# Patient Record
Sex: Female | Born: 1960 | Race: Black or African American | Hispanic: No | Marital: Single | Smoking: Never smoker
Health system: Southern US, Community
[De-identification: ages and names within clinical notes are randomized; demographics above are authoritative.]

## PROBLEM LIST (undated history)

## (undated) DIAGNOSIS — D649 Anemia, unspecified: Secondary | ICD-10-CM

## (undated) DIAGNOSIS — Z9289 Personal history of other medical treatment: Secondary | ICD-10-CM

## (undated) HISTORY — PX: OTHER SURGICAL HISTORY: SHX169

---

## 1999-02-07 ENCOUNTER — Inpatient Hospital Stay (HOSPITAL_COMMUNITY): Admission: AD | Admit: 1999-02-07 | Discharge: 1999-02-10 | Payer: Self-pay | Admitting: Obstetrics and Gynecology

## 2001-01-22 ENCOUNTER — Encounter: Admission: RE | Admit: 2001-01-22 | Discharge: 2001-04-22 | Payer: Self-pay | Admitting: Surgical Oncology

## 2002-03-15 ENCOUNTER — Other Ambulatory Visit: Admission: RE | Admit: 2002-03-15 | Discharge: 2002-03-15 | Payer: Self-pay | Admitting: Obstetrics and Gynecology

## 2002-08-24 ENCOUNTER — Emergency Department (HOSPITAL_COMMUNITY): Admission: EM | Admit: 2002-08-24 | Discharge: 2002-08-24 | Payer: Self-pay | Admitting: Emergency Medicine

## 2002-09-08 HISTORY — PX: GASTRIC BYPASS: SHX52

## 2004-09-08 HISTORY — PX: ABDOMINOPLASTY: SUR9

## 2004-10-16 ENCOUNTER — Ambulatory Visit (HOSPITAL_COMMUNITY): Admission: RE | Admit: 2004-10-16 | Discharge: 2004-10-16 | Payer: Self-pay | Admitting: Plastic Surgery

## 2004-11-12 ENCOUNTER — Encounter: Admission: RE | Admit: 2004-11-12 | Discharge: 2005-02-10 | Payer: Self-pay

## 2004-12-19 ENCOUNTER — Emergency Department (HOSPITAL_COMMUNITY): Admission: EM | Admit: 2004-12-19 | Discharge: 2004-12-19 | Payer: Self-pay | Admitting: Emergency Medicine

## 2004-12-23 ENCOUNTER — Other Ambulatory Visit: Admission: RE | Admit: 2004-12-23 | Discharge: 2004-12-23 | Payer: Self-pay | Admitting: Obstetrics and Gynecology

## 2004-12-30 ENCOUNTER — Ambulatory Visit (HOSPITAL_COMMUNITY): Admission: RE | Admit: 2004-12-30 | Discharge: 2004-12-30 | Payer: Self-pay | Admitting: Obstetrics and Gynecology

## 2005-01-02 ENCOUNTER — Ambulatory Visit (HOSPITAL_COMMUNITY): Admission: RE | Admit: 2005-01-02 | Discharge: 2005-01-02 | Payer: Self-pay | Admitting: Family Medicine

## 2006-10-07 ENCOUNTER — Ambulatory Visit (HOSPITAL_COMMUNITY): Admission: RE | Admit: 2006-10-07 | Discharge: 2006-10-07 | Payer: Self-pay | Admitting: Family Medicine

## 2008-11-21 ENCOUNTER — Encounter: Admission: RE | Admit: 2008-11-21 | Discharge: 2008-11-21 | Payer: Self-pay | Admitting: Family Medicine

## 2009-10-15 ENCOUNTER — Ambulatory Visit (HOSPITAL_COMMUNITY): Admission: RE | Admit: 2009-10-15 | Discharge: 2009-10-15 | Payer: Self-pay | Admitting: Family Medicine

## 2010-09-08 DIAGNOSIS — Z9289 Personal history of other medical treatment: Secondary | ICD-10-CM

## 2010-09-08 HISTORY — DX: Personal history of other medical treatment: Z92.89

## 2010-09-26 ENCOUNTER — Other Ambulatory Visit (HOSPITAL_COMMUNITY): Payer: Self-pay | Admitting: Family Medicine

## 2010-09-26 DIAGNOSIS — Z0000 Encounter for general adult medical examination without abnormal findings: Secondary | ICD-10-CM

## 2010-09-29 ENCOUNTER — Encounter: Payer: Self-pay | Admitting: Family Medicine

## 2010-10-16 ENCOUNTER — Ambulatory Visit (HOSPITAL_COMMUNITY): Admission: RE | Admit: 2010-10-16 | Payer: Self-pay | Source: Home / Self Care | Admitting: Family Medicine

## 2010-10-16 ENCOUNTER — Ambulatory Visit (HOSPITAL_COMMUNITY)
Admission: RE | Admit: 2010-10-16 | Discharge: 2010-10-16 | Disposition: A | Discharge status: Home / Self Care | Payer: 59 | Source: Ambulatory Visit | Attending: Family Medicine | Admitting: Family Medicine

## 2010-10-16 ENCOUNTER — Ambulatory Visit (HOSPITAL_COMMUNITY): Payer: Self-pay

## 2010-10-16 DIAGNOSIS — Z1231 Encounter for screening mammogram for malignant neoplasm of breast: Secondary | ICD-10-CM | POA: Insufficient documentation

## 2010-10-16 DIAGNOSIS — Z0000 Encounter for general adult medical examination without abnormal findings: Secondary | ICD-10-CM

## 2011-02-24 ENCOUNTER — Encounter (HOSPITAL_COMMUNITY): Payer: 59 | Attending: Family Medicine

## 2011-02-24 DIAGNOSIS — E538 Deficiency of other specified B group vitamins: Secondary | ICD-10-CM | POA: Insufficient documentation

## 2011-02-24 DIAGNOSIS — Z9884 Bariatric surgery status: Secondary | ICD-10-CM | POA: Insufficient documentation

## 2011-02-24 DIAGNOSIS — D509 Iron deficiency anemia, unspecified: Secondary | ICD-10-CM | POA: Insufficient documentation

## 2011-03-07 ENCOUNTER — Encounter: Payer: 59 | Admitting: Hematology and Oncology

## 2011-04-01 ENCOUNTER — Other Ambulatory Visit: Payer: Self-pay | Admitting: Hematology and Oncology

## 2011-04-01 ENCOUNTER — Encounter (HOSPITAL_BASED_OUTPATIENT_CLINIC_OR_DEPARTMENT_OTHER): Payer: 59 | Admitting: Hematology and Oncology

## 2011-04-01 DIAGNOSIS — D539 Nutritional anemia, unspecified: Secondary | ICD-10-CM

## 2011-04-01 DIAGNOSIS — Z9884 Bariatric surgery status: Secondary | ICD-10-CM

## 2011-04-01 DIAGNOSIS — D509 Iron deficiency anemia, unspecified: Secondary | ICD-10-CM

## 2011-04-01 LAB — CBC WITH DIFFERENTIAL/PLATELET
BASO%: 1.3 % (ref 0.0–2.0)
Basophils Absolute: 0 10*3/uL (ref 0.0–0.1)
EOS%: 4 % (ref 0.0–7.0)
Eosinophils Absolute: 0.1 10*3/uL (ref 0.0–0.5)
HCT: 34.4 % — ABNORMAL LOW (ref 34.8–46.6)
HGB: 10.7 g/dL — ABNORMAL LOW (ref 11.6–15.9)
LYMPH%: 39.8 % (ref 14.0–49.7)
MCH: 23.7 pg — ABNORMAL LOW (ref 25.1–34.0)
MCHC: 31.1 g/dL — ABNORMAL LOW (ref 31.5–36.0)
MCV: 76.1 fL — ABNORMAL LOW (ref 79.5–101.0)
MONO#: 0.3 10*3/uL (ref 0.1–0.9)
MONO%: 8.7 % (ref 0.0–14.0)
NEUT#: 1.4 10*3/uL — ABNORMAL LOW (ref 1.5–6.5)
NEUT%: 46.2 % (ref 38.4–76.8)
Platelets: 180 10*3/uL (ref 145–400)
RBC: 4.52 10*6/uL (ref 3.70–5.45)
WBC: 3 10*3/uL — ABNORMAL LOW (ref 3.9–10.3)
lymph#: 1.2 10*3/uL (ref 0.9–3.3)
nRBC: 0 % (ref 0–0)

## 2011-04-01 LAB — URINALYSIS, MICROSCOPIC - CHCC
Bilirubin (Urine): NEGATIVE
Blood: NEGATIVE
Glucose: NEGATIVE g/dL
Ketones: NEGATIVE mg/dL
Leukocyte Esterase: NEGATIVE
Nitrite: NEGATIVE
Protein: NEGATIVE mg/dL
RBC count: NEGATIVE (ref 0–2)
Specific Gravity, Urine: 1.01 (ref 1.003–1.035)
pH: 5 (ref 4.6–8.0)

## 2011-04-04 LAB — PROTEIN ELECTROPHORESIS, SERUM, WITH REFLEX
Albumin ELP: 56.4 % (ref 55.8–66.1)
Alpha-1-Globulin: 4.1 % (ref 2.9–4.9)
Alpha-2-Globulin: 9.3 % (ref 7.1–11.8)
Beta 2: 7.1 % — ABNORMAL HIGH (ref 3.2–6.5)
Beta Globulin: 5.5 % (ref 4.7–7.2)
Gamma Globulin: 17.6 % (ref 11.1–18.8)
Total Protein, Serum Electrophoresis: 6.8 g/dL (ref 6.0–8.3)

## 2011-04-04 LAB — COMPREHENSIVE METABOLIC PANEL
ALT: 10 U/L (ref 0–35)
AST: 18 U/L (ref 0–37)
Albumin: 4 g/dL (ref 3.5–5.2)
Alkaline Phosphatase: 36 U/L — ABNORMAL LOW (ref 39–117)
BUN: 9 mg/dL (ref 6–23)
CO2: 26 mEq/L (ref 19–32)
Calcium: 8.6 mg/dL (ref 8.4–10.5)
Chloride: 104 mEq/L (ref 96–112)
Creatinine, Ser: 0.66 mg/dL (ref 0.50–1.10)
Glucose, Bld: 83 mg/dL (ref 70–99)
Potassium: 3.7 mEq/L (ref 3.5–5.3)
Sodium: 139 mEq/L (ref 135–145)
Total Bilirubin: 0.3 mg/dL (ref 0.3–1.2)
Total Protein: 6.8 g/dL (ref 6.0–8.3)

## 2011-04-04 LAB — HEMOGLOBINOPATHY EVALUATION
Hemoglobin Other: 0 % (ref 0.0–0.0)
Hgb A2 Quant: 2.5 % (ref 2.2–3.2)
Hgb A: 97.5 % (ref 96.8–97.8)
Hgb F Quant: 0 % (ref 0.0–2.0)
Hgb S Quant: 0 % (ref 0.0–0.0)

## 2011-04-04 LAB — VITAMIN B12: Vitamin B-12: 519 pg/mL (ref 211–911)

## 2011-04-04 LAB — IRON AND TIBC
%SAT: 24 % (ref 20–55)
Iron: 67 ug/dL (ref 42–145)
TIBC: 274 ug/dL (ref 250–470)
UIBC: 207 ug/dL

## 2011-04-04 LAB — FOLATE: Folate: 17 ng/mL

## 2011-04-04 LAB — FERRITIN: Ferritin: 217 ng/mL (ref 10–291)

## 2011-09-10 ENCOUNTER — Other Ambulatory Visit (HOSPITAL_COMMUNITY): Payer: Self-pay | Admitting: Family Medicine

## 2011-09-10 DIAGNOSIS — Z1231 Encounter for screening mammogram for malignant neoplasm of breast: Secondary | ICD-10-CM

## 2011-10-20 ENCOUNTER — Ambulatory Visit (HOSPITAL_COMMUNITY)
Admission: RE | Admit: 2011-10-20 | Discharge: 2011-10-20 | Disposition: A | Discharge status: Home / Self Care | Payer: 59 | Source: Ambulatory Visit | Attending: Family Medicine | Admitting: Family Medicine

## 2011-10-20 DIAGNOSIS — Z1231 Encounter for screening mammogram for malignant neoplasm of breast: Secondary | ICD-10-CM

## 2012-09-14 ENCOUNTER — Encounter (HOSPITAL_COMMUNITY): Payer: Self-pay | Admitting: Pharmacist

## 2012-09-17 ENCOUNTER — Other Ambulatory Visit: Payer: Self-pay | Admitting: Obstetrics & Gynecology

## 2012-09-23 ENCOUNTER — Encounter (HOSPITAL_COMMUNITY): Payer: Self-pay

## 2012-09-23 ENCOUNTER — Encounter (HOSPITAL_COMMUNITY)
Admission: RE | Admit: 2012-09-23 | Discharge: 2012-09-23 | Disposition: A | Discharge status: Home / Self Care | Payer: 59 | Source: Ambulatory Visit | Attending: Obstetrics & Gynecology | Admitting: Obstetrics & Gynecology

## 2012-09-23 HISTORY — DX: Anemia, unspecified: D64.9

## 2012-09-23 HISTORY — DX: Personal history of other medical treatment: Z92.89

## 2012-09-23 LAB — CBC
HCT: 32.2 % — ABNORMAL LOW (ref 36.0–46.0)
Hemoglobin: 10.3 g/dL — ABNORMAL LOW (ref 12.0–15.0)
MCH: 27.2 pg (ref 26.0–34.0)
MCHC: 32 g/dL (ref 30.0–36.0)
MCV: 85 fL (ref 78.0–100.0)
Platelets: 228 10*3/uL (ref 150–400)
RBC: 3.79 MIL/uL — ABNORMAL LOW (ref 3.87–5.11)
RDW: 19 % — ABNORMAL HIGH (ref 11.5–15.5)
WBC: 4.6 10*3/uL (ref 4.0–10.5)

## 2012-09-23 LAB — SURGICAL PCR SCREEN
MRSA, PCR: NEGATIVE
Staphylococcus aureus: POSITIVE — AB

## 2012-09-23 NOTE — Patient Instructions (Addendum)
   Your procedure is scheduled RU:[id number] January 20th  Enter through the Main Entrance of [facility] at:11:30am Pick up the phone at the desk and dial [phone] and inform us of your arrival.  Please call this number if you have any problems the morning of surgery: [phone]  Remember: Do not eat food after midnight on Sunday You may have clear liquids until 9am on Monday then nothing   Do not wear jewelry, make-up, or FINGER nail polish No metal in your hair or on your body. Do not wear lotions, powders, perfumes. You may wear deodorant.  Please use your CHG wash as directed prior to surgery.  Do not shave anywhere for at least 12 hours prior to first CHG shower.  Do not bring valuables to the hospital. Contacts, dentures or bridgework may not be worn into surgery.  Leave suitcase in the car. After Surgery it may be brought to your room. For patients being admitted to the hospital, checkout time is 11:00am the day of discharge.  Patients discharged on the day of surgery will not be allowed to drive home.

## 2012-09-26 MED ORDER — DEXTROSE 5 % IV SOLN
2.0000 g | Freq: Once | INTRAVENOUS | Status: AC
  Administered 2012-09-27: 2 g via INTRAVENOUS
  Filled 2012-09-26: qty 2

## 2012-09-27 ENCOUNTER — Encounter (HOSPITAL_COMMUNITY)
Admission: RE | Disposition: A | Discharge status: Home / Self Care | Payer: Self-pay | Source: Ambulatory Visit | Attending: Obstetrics & Gynecology

## 2012-09-27 ENCOUNTER — Ambulatory Visit (HOSPITAL_COMMUNITY): Payer: 59 | Admitting: Certified Registered"

## 2012-09-27 ENCOUNTER — Encounter (HOSPITAL_COMMUNITY): Payer: Self-pay | Admitting: *Deleted

## 2012-09-27 ENCOUNTER — Observation Stay (HOSPITAL_COMMUNITY)
Admission: RE | Admit: 2012-09-27 | Discharge: 2012-09-28 | Disposition: A | Discharge status: Home / Self Care | Payer: 59 | Source: Ambulatory Visit | Attending: Obstetrics & Gynecology | Admitting: Obstetrics & Gynecology

## 2012-09-27 ENCOUNTER — Encounter (HOSPITAL_COMMUNITY): Payer: Self-pay | Admitting: Certified Registered"

## 2012-09-27 DIAGNOSIS — Z01818 Encounter for other preprocedural examination: Secondary | ICD-10-CM | POA: Insufficient documentation

## 2012-09-27 DIAGNOSIS — N8000 Endometriosis of the uterus, unspecified: Secondary | ICD-10-CM | POA: Insufficient documentation

## 2012-09-27 DIAGNOSIS — N946 Dysmenorrhea, unspecified: Secondary | ICD-10-CM | POA: Insufficient documentation

## 2012-09-27 DIAGNOSIS — N920 Excessive and frequent menstruation with regular cycle: Principal | ICD-10-CM | POA: Insufficient documentation

## 2012-09-27 DIAGNOSIS — Z90710 Acquired absence of both cervix and uterus: Secondary | ICD-10-CM | POA: Diagnosis not present

## 2012-09-27 DIAGNOSIS — Z01812 Encounter for preprocedural laboratory examination: Secondary | ICD-10-CM | POA: Insufficient documentation

## 2012-09-27 DIAGNOSIS — N949 Unspecified condition associated with female genital organs and menstrual cycle: Secondary | ICD-10-CM | POA: Insufficient documentation

## 2012-09-27 HISTORY — PX: ROBOTIC ASSISTED TOTAL HYSTERECTOMY: SHX6085

## 2012-09-27 HISTORY — PX: BILATERAL SALPINGECTOMY: SHX5743

## 2012-09-27 LAB — TYPE AND SCREEN
ABO/RH(D): O POS
Antibody Screen: NEGATIVE

## 2012-09-27 LAB — ABO/RH: ABO/RH(D): O POS

## 2012-09-27 LAB — PREGNANCY, URINE: Preg Test, Ur: NEGATIVE

## 2012-09-27 SURGERY — ROBOTIC ASSISTED TOTAL HYSTERECTOMY
Anesthesia: General | Wound class: Clean Contaminated

## 2012-09-27 MED ORDER — ACETAMINOPHEN 10 MG/ML IV SOLN
INTRAVENOUS | Status: AC
  Filled 2012-09-27: qty 100

## 2012-09-27 MED ORDER — KETOROLAC TROMETHAMINE 30 MG/ML IJ SOLN
INTRAMUSCULAR | Status: AC
  Administered 2012-09-27: 30 mg via INTRAVENOUS
  Filled 2012-09-27: qty 1

## 2012-09-27 MED ORDER — DEXAMETHASONE SODIUM PHOSPHATE 10 MG/ML IJ SOLN
INTRAMUSCULAR | Status: AC
  Filled 2012-09-27: qty 1

## 2012-09-27 MED ORDER — FENTANYL CITRATE 0.05 MG/ML IJ SOLN
INTRAMUSCULAR | Status: AC
  Filled 2012-09-27: qty 5

## 2012-09-27 MED ORDER — FENTANYL CITRATE 0.05 MG/ML IJ SOLN
INTRAMUSCULAR | Status: AC
  Filled 2012-09-27: qty 2

## 2012-09-27 MED ORDER — PROPOFOL 10 MG/ML IV EMUL
INTRAVENOUS | Status: AC
  Filled 2012-09-27: qty 20

## 2012-09-27 MED ORDER — FENTANYL CITRATE 0.05 MG/ML IJ SOLN
INTRAMUSCULAR | Status: DC | PRN
  Administered 2012-09-27: 200 ug via INTRAVENOUS
  Administered 2012-09-27: 50 ug via INTRAVENOUS
  Administered 2012-09-27: 100 ug via INTRAVENOUS

## 2012-09-27 MED ORDER — LIDOCAINE HCL (CARDIAC) 20 MG/ML IV SOLN
INTRAVENOUS | Status: DC | PRN
  Administered 2012-09-27: 60 mg via INTRAVENOUS

## 2012-09-27 MED ORDER — FENTANYL CITRATE 0.05 MG/ML IJ SOLN
25.0000 ug | INTRAMUSCULAR | Status: DC | PRN
  Administered 2012-09-27: 50 ug via INTRAVENOUS

## 2012-09-27 MED ORDER — LACTATED RINGERS IV SOLN
INTRAVENOUS | Status: DC
  Administered 2012-09-27 – 2012-09-28 (×2): via INTRAVENOUS

## 2012-09-27 MED ORDER — ONDANSETRON HCL 4 MG/2ML IJ SOLN
INTRAMUSCULAR | Status: DC | PRN
  Administered 2012-09-27: 4 mg via INTRAVENOUS

## 2012-09-27 MED ORDER — NEOSTIGMINE METHYLSULFATE 1 MG/ML IJ SOLN
INTRAMUSCULAR | Status: DC | PRN
  Administered 2012-09-27: 2 mg via INTRAVENOUS

## 2012-09-27 MED ORDER — DEXAMETHASONE SODIUM PHOSPHATE 4 MG/ML IJ SOLN
INTRAMUSCULAR | Status: DC | PRN
  Administered 2012-09-27: 4 mg via INTRAVENOUS

## 2012-09-27 MED ORDER — ACETAMINOPHEN 10 MG/ML IV SOLN
1000.0000 mg | Freq: Four times a day (QID) | INTRAVENOUS | Status: DC
  Administered 2012-09-27: 1000 mg via INTRAVENOUS

## 2012-09-27 MED ORDER — LACTATED RINGERS IV SOLN
INTRAVENOUS | Status: DC
  Administered 2012-09-27 (×3): via INTRAVENOUS

## 2012-09-27 MED ORDER — BUPIVACAINE HCL (PF) 0.25 % IJ SOLN
INTRAMUSCULAR | Status: DC | PRN
  Administered 2012-09-27: 16 mL

## 2012-09-27 MED ORDER — MIDAZOLAM HCL 5 MG/5ML IJ SOLN
INTRAMUSCULAR | Status: DC | PRN
  Administered 2012-09-27: 2 mg via INTRAVENOUS

## 2012-09-27 MED ORDER — STERILE WATER FOR IRRIGATION IR SOLN
Status: DC | PRN
  Administered 2012-09-27: 1000 mL via INTRAVESICAL

## 2012-09-27 MED ORDER — ROCURONIUM BROMIDE 100 MG/10ML IV SOLN
INTRAVENOUS | Status: DC | PRN
  Administered 2012-09-27: 50 mg via INTRAVENOUS
  Administered 2012-09-27 (×2): 15 mg via INTRAVENOUS
  Administered 2012-09-27: 10 mg via INTRAVENOUS

## 2012-09-27 MED ORDER — IBUPROFEN 600 MG PO TABS: 600.0000 mg | ORAL_TABLET | Freq: Four times a day (QID) | ORAL | Status: DC | PRN

## 2012-09-27 MED ORDER — KETOROLAC TROMETHAMINE 30 MG/ML IJ SOLN
15.0000 mg | Freq: Once | INTRAMUSCULAR | Status: AC | PRN
  Administered 2012-09-27: 30 mg via INTRAVENOUS

## 2012-09-27 MED ORDER — GLYCOPYRROLATE 0.2 MG/ML IJ SOLN
INTRAMUSCULAR | Status: AC
  Filled 2012-09-27: qty 1

## 2012-09-27 MED ORDER — FENTANYL CITRATE 0.05 MG/ML IJ SOLN
INTRAMUSCULAR | Status: AC
  Administered 2012-09-27: 50 ug via INTRAVENOUS
  Filled 2012-09-27: qty 2

## 2012-09-27 MED ORDER — OXYCODONE-ACETAMINOPHEN 5-325 MG PO TABS
1.0000 | ORAL_TABLET | ORAL | Status: DC | PRN
  Administered 2012-09-27 – 2012-09-28 (×4): 1 via ORAL
  Filled 2012-09-27 (×4): qty 1

## 2012-09-27 MED ORDER — HYDROMORPHONE HCL PF 1 MG/ML IJ SOLN: 1.0000 mg | INTRAMUSCULAR | Status: DC | PRN

## 2012-09-27 MED ORDER — MIDAZOLAM HCL 2 MG/2ML IJ SOLN: 0.5000 mg | Freq: Once | INTRAMUSCULAR | Status: DC | PRN

## 2012-09-27 MED ORDER — MIDAZOLAM HCL 2 MG/2ML IJ SOLN
INTRAMUSCULAR | Status: AC
  Filled 2012-09-27: qty 2

## 2012-09-27 MED ORDER — ROCURONIUM BROMIDE 50 MG/5ML IV SOLN
INTRAVENOUS | Status: AC
  Filled 2012-09-27: qty 1

## 2012-09-27 MED ORDER — MEPERIDINE HCL 25 MG/ML IJ SOLN: 6.2500 mg | INTRAMUSCULAR | Status: DC | PRN

## 2012-09-27 MED ORDER — LACTATED RINGERS IR SOLN
Status: DC | PRN
  Administered 2012-09-27: 3000 mL

## 2012-09-27 MED ORDER — GLYCOPYRROLATE 0.2 MG/ML IJ SOLN
INTRAMUSCULAR | Status: DC | PRN
  Administered 2012-09-27: 0.4 mg via INTRAVENOUS

## 2012-09-27 MED ORDER — PROPOFOL 10 MG/ML IV EMUL
INTRAVENOUS | Status: DC | PRN
  Administered 2012-09-27: 200 mg via INTRAVENOUS
  Administered 2012-09-27 (×2): 50 mg via INTRAVENOUS

## 2012-09-27 MED ORDER — BUPIVACAINE HCL (PF) 0.25 % IJ SOLN
INTRAMUSCULAR | Status: AC
  Filled 2012-09-27: qty 30

## 2012-09-27 MED ORDER — PROMETHAZINE HCL 25 MG/ML IJ SOLN: 6.2500 mg | INTRAMUSCULAR | Status: DC | PRN

## 2012-09-27 MED ORDER — NEOSTIGMINE METHYLSULFATE 1 MG/ML IJ SOLN
INTRAMUSCULAR | Status: AC
  Filled 2012-09-27: qty 1

## 2012-09-27 MED ORDER — ONDANSETRON HCL 4 MG/2ML IJ SOLN
INTRAMUSCULAR | Status: AC
  Filled 2012-09-27: qty 2

## 2012-09-27 MED ORDER — LIDOCAINE HCL (PF) 1 % IJ SOLN
INTRAMUSCULAR | Status: AC
  Filled 2012-09-27: qty 30

## 2012-09-27 SURGICAL SUPPLY — 63 items
BAG URINE DRAINAGE (UROLOGICAL SUPPLIES) ×3 IMPLANT
BARRIER ADHS 3X4 INTERCEED (GAUZE/BANDAGES/DRESSINGS) ×3 IMPLANT
BLADE LAPAROSCOPIC MORCELL KIT (BLADE) IMPLANT
CATH FOLEY 3WAY  5CC 16FR (CATHETERS) ×1
CATH FOLEY 3WAY 5CC 16FR (CATHETERS) ×2 IMPLANT
CLOTH BEACON ORANGE TIMEOUT ST (SAFETY) ×3 IMPLANT
CONT PATH 16OZ SNAP LID 3702 (MISCELLANEOUS) ×3 IMPLANT
COVER MAYO STAND STRL (DRAPES) ×3 IMPLANT
COVER TABLE BACK 60X90 (DRAPES) ×6 IMPLANT
COVER TIP SHEARS 8 DVNC (MISCELLANEOUS) ×2 IMPLANT
COVER TIP SHEARS 8MM DA VINCI (MISCELLANEOUS) ×1
DECANTER SPIKE VIAL GLASS SM (MISCELLANEOUS) ×3 IMPLANT
DERMABOND ADVANCED (GAUZE/BANDAGES/DRESSINGS) ×1
DERMABOND ADVANCED .7 DNX12 (GAUZE/BANDAGES/DRESSINGS) ×2 IMPLANT
DRAPE HUG U DISPOSABLE (DRAPE) ×3 IMPLANT
DRAPE LG THREE QUARTER DISP (DRAPES) ×6 IMPLANT
DRAPE WARM FLUID 44X44 (DRAPE) ×3 IMPLANT
ELECT REM PT RETURN 9FT ADLT (ELECTROSURGICAL) ×3
ELECTRODE REM PT RTRN 9FT ADLT (ELECTROSURGICAL) ×2 IMPLANT
EVACUATOR SMOKE 8.L (FILTER) ×3 IMPLANT
GAUZE VASELINE 3X9 (GAUZE/BANDAGES/DRESSINGS) IMPLANT
GLOVE BIO SURGEON STRL SZ 6.5 (GLOVE) ×6 IMPLANT
GLOVE BIO SURGEON STRL SZ7 (GLOVE) ×6 IMPLANT
GLOVE BIOGEL PI IND STRL 7.0 (GLOVE) ×2 IMPLANT
GLOVE BIOGEL PI INDICATOR 7.0 (GLOVE) ×1
GOWN STRL REIN XL XLG (GOWN DISPOSABLE) ×18 IMPLANT
IV STOPCOCK 4 WAY 40  W/Y SET (IV SOLUTION)
IV STOPCOCK 4 WAY 40 W/Y SET (IV SOLUTION) IMPLANT
KIT ACCESSORY DA VINCI DISP (KITS) ×1
KIT ACCESSORY DVNC DISP (KITS) ×2 IMPLANT
LEGGING LITHOTOMY PAIR STRL (DRAPES) ×3 IMPLANT
NEEDLE HYPO 22GX1.5 SAFETY (NEEDLE) IMPLANT
OCCLUDER COLPOPNEUMO (BALLOONS) ×3 IMPLANT
PACK LAVH (CUSTOM PROCEDURE TRAY) ×3 IMPLANT
PAD PREP 24X48 CUFFED NSTRL (MISCELLANEOUS) ×6 IMPLANT
PLUG CATH AND CAP STER (CATHETERS) ×3 IMPLANT
PROTECTOR NERVE ULNAR (MISCELLANEOUS) ×6 IMPLANT
SET CYSTO W/LG BORE CLAMP LF (SET/KITS/TRAYS/PACK) ×3 IMPLANT
SET IRRIG TUBING LAPAROSCOPIC (IRRIGATION / IRRIGATOR) ×6 IMPLANT
SOLUTION ELECTROLUBE (MISCELLANEOUS) ×3 IMPLANT
SUT VIC AB 0 CT1 27 (SUTURE)
SUT VIC AB 0 CT1 27XBRD ANTBC (SUTURE) IMPLANT
SUT VIC AB 4-0 PS2 27 (SUTURE) ×6 IMPLANT
SUT VICRYL 0 27 CT2 27 ABS (SUTURE) IMPLANT
SUT VICRYL 0 UR6 27IN ABS (SUTURE) ×6 IMPLANT
SUT VLOC 180 0 9IN  GS21 (SUTURE) ×1
SUT VLOC 180 0 9IN GS21 (SUTURE) ×2 IMPLANT
SYR 50ML LL SCALE MARK (SYRINGE) ×3 IMPLANT
SYSTEM CONVERTIBLE TROCAR (TROCAR) IMPLANT
TIP RUMI ORANGE 6.7MMX12CM (TIP) IMPLANT
TIP UTERINE 5.1X6CM LAV DISP (MISCELLANEOUS) IMPLANT
TIP UTERINE 6.7X10CM GRN DISP (MISCELLANEOUS) ×3 IMPLANT
TIP UTERINE 6.7X6CM WHT DISP (MISCELLANEOUS) IMPLANT
TIP UTERINE 6.7X8CM BLUE DISP (MISCELLANEOUS) IMPLANT
TOWEL OR 17X24 6PK STRL BLUE (TOWEL DISPOSABLE) ×9 IMPLANT
TROCAR 12M 150ML BLUNT (TROCAR) IMPLANT
TROCAR DISP BLADELESS 8 DVNC (TROCAR) ×2 IMPLANT
TROCAR DISP BLADELESS 8MM (TROCAR) ×1
TROCAR XCEL 12X100 BLDLESS (ENDOMECHANICALS) ×3 IMPLANT
TROCAR XCEL NON-BLD 5MMX100MML (ENDOMECHANICALS) ×3 IMPLANT
TUBING FILTER THERMOFLATOR (ELECTROSURGICAL) ×6 IMPLANT
WARMER LAPAROSCOPE (MISCELLANEOUS) ×3 IMPLANT
WATER STERILE IRR 1000ML POUR (IV SOLUTION) ×9 IMPLANT

## 2012-09-27 NOTE — H&P (Signed)
Sabrina Johnson is an 52 y.o. female G1P1 C/S  RP:  Pelvic pain, dysmeno, menorrhagia with uterine myomas/adenomyosis for TLH/Salpingectomies Engineer, building services  Pertinent Gynecological History: Menses: flow is excessive with use of many pads or tampons on heaviest days Contraception: abstinence Blood transfusions: none Sexually transmitted diseases: no past history  Last mammogram: normal  Last pap: normal G1P1 C/S    Menstrual History:  No LMP recorded.    Past Medical History  Diagnosis Date  . Anemia   . History of blood transfusion 2012    [place]    Past Surgical History  Procedure Date  . Gastric bypass 2004  . Abdominal    . Abdominoplasty 2006    No family history on file.  Social History:  reports that she has never smoked. She does not have any smokeless tobacco history on file. She reports that she does not drink alcohol or use illicit drugs.  Allergies: No Known Allergies  Prescriptions prior to admission  Medication Sig Dispense Refill  . Biotin 2500 MCG CAPS Take 1 tablet by mouth daily.      . ferrous sulfate 325 (65 FE) MG tablet Take 325 mg by mouth daily with breakfast.        There were no vitals taken for this visit.  Results for orders placed during the hospital encounter of 09/27/12 (from the past 24 hour(s))  PREGNANCY, URINE     Status: Normal   Collection Time   09/27/12 11:30 AM      Component Value Range   Preg Test, Ur NEGATIVE  NEGATIVE  TYPE AND SCREEN     Status: Normal (Preliminary result)   Collection Time   09/27/12 11:36 AM      Component Value Range   ABO/RH(D) O POS     Antibody Screen PENDING     Sample Expiration 09/30/2012      No results found.  Assessment/Plan: Pelvic pain, dysmeno, menorrhagia with myomas and probable adenomyosis for TLH, bilat. Salpingectomies assisted with [employer] robot.  Surgery and risks reviewed.  Sabrina Johnson,Sabrina Johnson 09/27/2012, 12:35 PM

## 2012-09-27 NOTE — Anesthesia Preprocedure Evaluation (Signed)
Anesthesia Evaluation  Patient identified by MRN, date of birth, ID band Patient awake    Reviewed: Allergy & Precautions, H&P , Patient's Chart, lab work & pertinent test results, reviewed documented beta blocker date and time   History of Anesthesia Complications Negative for: history of anesthetic complications  Airway Mallampati: II TM Distance: >3 FB Neck ROM: full    Dental No notable dental hx.    Pulmonary neg pulmonary ROS,  breath sounds clear to auscultation  Pulmonary exam normal       Cardiovascular Exercise Tolerance: Good negative cardio ROS  Rhythm:regular Rate:Normal     Neuro/Psych negative neurological ROS  negative psych ROS   GI/Hepatic negative GI ROS, Neg liver ROS,   Endo/Other  negative endocrine ROS  Renal/GU negative Renal ROS     Musculoskeletal   Abdominal   Peds  Hematology negative hematology ROS (+)   Anesthesia Other Findings   Reproductive/Obstetrics negative OB ROS                           Anesthesia Physical Anesthesia Plan  ASA: II  Anesthesia Plan: General ETT   Post-op Pain Management:    Induction:   Airway Management Planned:   Additional Equipment:   Intra-op Plan:   Post-operative Plan:   Informed Consent: I have reviewed the patients History and Physical, chart, labs and discussed the procedure including the risks, benefits and alternatives for the proposed anesthesia with the patient or authorized representative who has indicated his/her understanding and acceptance.   Dental Advisory Given  Plan Discussed with: CRNA and Surgeon  Anesthesia Plan Comments:         Anesthesia Quick Evaluation

## 2012-09-27 NOTE — Anesthesia Postprocedure Evaluation (Signed)
Anesthesia Post Note  Patient: Sabrina Johnson  Procedure(s) Performed: Procedure(s) (LRB): ROBOTIC ASSISTED TOTAL HYSTERECTOMY (N/A) BILATERAL SALPINGECTOMY (Bilateral)  Anesthesia type: GA  Patient location: PACU  Post pain: Pain level controlled  Post assessment: Post-op Vital signs reviewed  Last Vitals:  Filed Vitals:   09/27/12 1715  BP: 112/53  Pulse: 71  Temp:   Resp: 17    Post vital signs: Reviewed  Level of consciousness: sedated  Complications: No apparent anesthesia complications

## 2012-09-27 NOTE — Op Note (Signed)
09/27/2012  4:18 PM  PATIENT:  Sabrina Johnson  52 y.o. female  PRE-OPERATIVE DIAGNOSIS:  Myomas, Menorrhagia, Dysmenorrhea, Possible Adenomyosis  POST-OPERATIVE DIAGNOSIS:  Myomas,Menorrhagia, Dysmenorrhea, Adenomyosis  PROCEDURE:  Procedure(s): ROBOTIC ASSISTED TOTAL HYSTERECTOMY BILATERAL SALPINGECTOMIES  SURGEON:  Surgeon(s): Genia Del, MD Robley Fries, MD  ASSISTANTS: Dr. Robley Fries   ANESTHESIA:   general  PROCEDURE:  Under general anesthesia with an endotracheal intubation the patient is in lithotomy position. She is prepped with on ChloraPrep on the abdomen and with Betadine on the suprapubic vulvar and vaginal areas. She is draped as usual. The patient had an abdominoplasty in the past and because of the reconstruction of her umbilicus it appeared to have been prone to debris retention.  Those debris were removed first and after prepping and draping, the umbilicus was sealed with an adhesive dressing.  We made the a vertical the incision over 1.5 cm in the midline above the umbilicus at the site of the previous scar.  The skin was infiltrated with Marcaine one quarter plain. The aponeurosis was opened under direct vision with Mayo scissors and the peritoneum was opened bluntly with a finger.  A pursestring stitch of Vicryl 0 was applied on the aponeurosis. The Roseanne Reno was introduced and a pneumoperitoneum was created with CO2.  The camera was introduced at that level. The anterior wall was free of adhesions on 4 ports insertion on both sides. The pelvis demonstrated an enlarged uterus probably with adenomyosis and possibly with a fibroid, both ovaries were normal to inspection and both tubes.  A semicircular configuration was used for ports placement.  The skin was infiltrated with Marcaine on each side and a small incision was made with a scalpel. Each port was inserted under direct vision. The robot was then docked from the right side. The instruments were put in place  with the Endo Shears scissors and the right arm the PK in the left arm and the fenestrated clamp and the third arm. We went to the console. The abdomen was normal to inspection the liver and the gallbladder were normal.  We started on the left side, we cauterized and sectioned the round ligament, then cauterized and sectioned the mesosalpinx. We cauterized the left utero-ovarian ligament and then followed down on the left lateral side of the uterus. The visceral peritoneum was opened anteriorly.  We proceeded exactly the same way on the right side. Both ureters were identified. At the lower aspect of the uterus on the right side adhesions were present with the lateral wall.  We then opened the peritoneum anteriorly and started declining the bladder downwards passed the core ring. Probably because of a previous C-section there were adhesions.  We filled up the bladder to assure that we were safely declining it.  We then cauterized the left uterine artery and section it and finally cauterized and sectioned the right uterine artery we then inflated the occluder and opened the vaginal vault over the ring anteriorly on the left posteriorly and finish on the right side. The uterus was completely detached with both tubes and the cervix. We then morcellated with the tip of the Endo Shears scissors on sectioning the uterus and I 2 half to be able to pass vaginally. The specimen was sent to pathology. We then completed hemostasis on the vaginal vault with the PK.  We switched instruments to a cutting needle driver in the right-hand the long tip in the left hand and the PK in the third arm.  We used V. LOC 9 inches to close the vaginal vault starting on the right angle, finishing on the left and coming back to about halfway of the vagina.  Hemostasis was on filed at all levels. We irrigated and suctioned the abdominopelvic cavities. All instruments were removed. The robot was undocked. And went to laparoscopy. The needle was  removed from the abdomen. We then irrigated and suctioned once more. Confirmed good hemostasis. All laparoscopy instruments were removed. The CO2 was evacuated. The pursestring stitch was attached at the supraumbilical incision. The Bovie was used to complete hemostasis on the incisions and then a 4-0 Vicryl was used to close the skin with a subcuticular stitch.  Dermabond was added on all incisions. The occluder was removed from the vagina. The patient was brought to recovery room in good and stable status.  ESTIMATED BLOOD LOSS: 75 cc   Intake/Output Summary (Last 24 hours) at 09/27/12 1618 Last data filed at 09/27/12 1610  Gross per 24 hour  Intake   2000 ml  Output    275 ml  Net   1725 ml     BLOOD ADMINISTERED:none   LOCAL MEDICATIONS USED:  MARCAINE     SPECIMEN:  Source of Specimen:  Uterus with both tubes and cervix  DISPOSITION OF SPECIMEN:  PATHOLOGY  COUNTS:  YES  PLAN OF CARE: Transfer to PACU    Genia Del MD  09/27/2012  At 4:21 pm

## 2012-09-27 NOTE — Transfer of Care (Signed)
Immediate Anesthesia Transfer of Care Note  Patient: Sabrina Johnson  Procedure(s) Performed: Procedure(s) (LRB) with comments: ROBOTIC ASSISTED TOTAL HYSTERECTOMY (N/A) BILATERAL SALPINGECTOMY (Bilateral)  Patient Location: PACU  Anesthesia Type:General  Level of Consciousness: sedated  Airway & Oxygen Therapy: Patient Spontanous Breathing and Patient connected to nasal cannula oxygen  Post-op Assessment: Report given to PACU RN and Post -op Vital signs reviewed and stable  Post vital signs: stable  Complications: No apparent anesthesia complications

## 2012-09-28 ENCOUNTER — Encounter (HOSPITAL_COMMUNITY): Payer: Self-pay | Admitting: *Deleted

## 2012-09-28 DIAGNOSIS — Z90710 Acquired absence of both cervix and uterus: Secondary | ICD-10-CM | POA: Diagnosis not present

## 2012-09-28 LAB — CBC
HCT: 29.2 % — ABNORMAL LOW (ref 36.0–46.0)
Hemoglobin: 9.3 g/dL — ABNORMAL LOW (ref 12.0–15.0)
MCH: 27.2 pg (ref 26.0–34.0)
MCHC: 31.8 g/dL (ref 30.0–36.0)
MCV: 85.4 fL (ref 78.0–100.0)
Platelets: 238 10*3/uL (ref 150–400)
RBC: 3.42 MIL/uL — ABNORMAL LOW (ref 3.87–5.11)
RDW: 18.8 % — ABNORMAL HIGH (ref 11.5–15.5)
WBC: 7.8 10*3/uL (ref 4.0–10.5)

## 2012-09-28 MED ORDER — OXYCODONE-ACETAMINOPHEN 7.5-325 MG PO TABS: 1.0000 | ORAL_TABLET | ORAL | Status: DC | PRN

## 2012-09-28 NOTE — Progress Notes (Signed)
1 Day Post-Op Procedure(s) (LRB): ROBOTIC ASSISTED TOTAL HYSTERECTOMY (N/A) BILATERAL SALPINGECTOMY (Bilateral)  Subjective: Patient reports that pain is well managed.  Tolerating normal diet as tolerated  diet without difficulty. No nausea / vomiting.  Ambulating and voiding.  Objective: BP 103/64  Pulse 64  Temp 98.1 F (36.7 C) (Oral)  Resp 18  Ht 5\' 5"  (1.651 m)  Wt 92.987 kg (205 lb)  BMI 34.11 kg/m2  SpO2 99% Lungs: clear Heart: normal rate and rhythm Abdomen:soft and appropriately tender Extremities: Homans sign is negative, no sign of DVT Incision: healing well  Assessment: s/p Procedure(s): ROBOTIC ASSISTED TOTAL HYSTERECTOMY BILATERAL SALPINGECTOMY: progressing well  Plan: Discharge home  LOS: 1 day    Kian Gamarra,MARIE-LYNE 09/28/2012, 7:22 AM

## 2012-09-28 NOTE — Discharge Summary (Signed)
  Physician Discharge Summary  Patient ID: ARIYONA EID MRN: [id number] DOB/AGE: 51-12-1960 51 y.o.  Admit date: 09/27/2012 Discharge date: 09/28/2012  Admission Diagnoses: Myomas, Menorrhagia, Possible Adenomyosis  Discharge Diagnoses: Myomas, Menorrhagia, Possible Adenomyosis        Active Problems:  * No active hospital problems. *    Discharged Condition: good  Hospital Course: good  Consults: None  Treatments: surgery: Total laparoscopic Hysterectomy with bilateral salpingectomies assisted with [employer].  Disposition: D/C home     Medication List     As of 09/28/2012  7:24 AM    TAKE these medications         Biotin 2500 MCG Caps   Take 1 tablet by mouth daily.      ferrous sulfate 325 (65 FE) MG tablet   Take 325 mg by mouth daily with breakfast.      oxyCODONE-acetaminophen 7.5-325 MG per tablet   Commonly known as: PERCOCET   Take 1 tablet by mouth every 4 (four) hours as needed for pain.         SignedGenia Del, MD 09/28/2012, 7:24 AM

## 2012-09-28 NOTE — Progress Notes (Signed)
Pt is discharged in the care of friend. Downstairs per ambulatory. Stable Abdominal post incisions are clean and dry. Given discharge instructions and comprehened well. Questions were asked and answered. Denies any pain or discomfort.

## 2012-09-28 NOTE — Anesthesia Postprocedure Evaluation (Signed)
Anesthesia Post Note  Patient: Sabrina Johnson  Procedure(s) Performed: Procedure(s) (LRB): ROBOTIC ASSISTED TOTAL HYSTERECTOMY (N/A) BILATERAL SALPINGECTOMY (Bilateral)  Anesthesia type: General  Patient location: Mother/Baby  Post pain: Pain level controlled  Post assessment: Post-op Vital signs reviewed  Last Vitals:  Filed Vitals:   09/28/12 0547  BP: 103/64  Pulse: 64  Temp: 36.7 C  Resp: 18    Post vital signs: Reviewed  Level of consciousness: awake and alert   Complications: No apparent anesthesia complications

## 2012-09-28 NOTE — Progress Notes (Signed)
UR completed 

## 2013-10-11 ENCOUNTER — Other Ambulatory Visit (HOSPITAL_COMMUNITY): Payer: Self-pay | Admitting: Family Medicine

## 2013-10-11 DIAGNOSIS — Z1231 Encounter for screening mammogram for malignant neoplasm of breast: Secondary | ICD-10-CM

## 2013-10-20 ENCOUNTER — Ambulatory Visit (HOSPITAL_COMMUNITY)
Admission: RE | Admit: 2013-10-20 | Discharge: 2013-10-20 | Disposition: A | Discharge status: Home / Self Care | Payer: 59 | Source: Ambulatory Visit | Attending: Family Medicine | Admitting: Family Medicine

## 2013-10-20 DIAGNOSIS — Z1231 Encounter for screening mammogram for malignant neoplasm of breast: Secondary | ICD-10-CM | POA: Insufficient documentation

## 2013-11-23 ENCOUNTER — Other Ambulatory Visit: Payer: Self-pay | Admitting: Obstetrics & Gynecology

## 2013-11-23 DIAGNOSIS — R109 Unspecified abdominal pain: Secondary | ICD-10-CM

## 2013-11-23 DIAGNOSIS — R102 Pelvic and perineal pain: Secondary | ICD-10-CM

## 2013-11-28 ENCOUNTER — Other Ambulatory Visit: Payer: 59

## 2013-12-02 ENCOUNTER — Ambulatory Visit
Admission: RE | Admit: 2013-12-02 | Discharge: 2013-12-02 | Disposition: A | Discharge status: Home / Self Care | Payer: 59 | Source: Ambulatory Visit | Attending: Obstetrics & Gynecology | Admitting: Obstetrics & Gynecology

## 2013-12-02 ENCOUNTER — Other Ambulatory Visit: Payer: 59

## 2013-12-02 DIAGNOSIS — R102 Pelvic and perineal pain: Secondary | ICD-10-CM

## 2013-12-02 DIAGNOSIS — R109 Unspecified abdominal pain: Secondary | ICD-10-CM

## 2014-09-20 ENCOUNTER — Other Ambulatory Visit (HOSPITAL_COMMUNITY): Payer: Self-pay | Admitting: Family Medicine

## 2014-09-20 DIAGNOSIS — Z1231 Encounter for screening mammogram for malignant neoplasm of breast: Secondary | ICD-10-CM

## 2014-10-23 ENCOUNTER — Ambulatory Visit (HOSPITAL_COMMUNITY): Payer: Self-pay

## 2014-12-25 ENCOUNTER — Ambulatory Visit (HOSPITAL_COMMUNITY)
Admission: RE | Admit: 2014-12-25 | Discharge: 2014-12-25 | Disposition: A | Discharge status: Home / Self Care | Payer: 59 | Source: Ambulatory Visit | Attending: Family Medicine | Admitting: Family Medicine

## 2014-12-25 DIAGNOSIS — Z1231 Encounter for screening mammogram for malignant neoplasm of breast: Secondary | ICD-10-CM | POA: Insufficient documentation

## 2015-10-08 ENCOUNTER — Other Ambulatory Visit: Payer: Self-pay

## 2015-10-08 DIAGNOSIS — Z1231 Encounter for screening mammogram for malignant neoplasm of breast: Secondary | ICD-10-CM

## 2015-12-26 ENCOUNTER — Ambulatory Visit: Payer: 59

## 2015-12-26 ENCOUNTER — Ambulatory Visit
Admission: RE | Admit: 2015-12-26 | Discharge: 2015-12-26 | Disposition: A | Discharge status: Home / Self Care | Payer: 59 | Source: Ambulatory Visit

## 2015-12-26 DIAGNOSIS — Z1231 Encounter for screening mammogram for malignant neoplasm of breast: Secondary | ICD-10-CM

## 2016-09-19 DIAGNOSIS — E538 Deficiency of other specified B group vitamins: Secondary | ICD-10-CM | POA: Diagnosis not present

## 2016-10-16 ENCOUNTER — Other Ambulatory Visit: Payer: Self-pay | Admitting: Family Medicine

## 2016-10-16 DIAGNOSIS — Z1231 Encounter for screening mammogram for malignant neoplasm of breast: Secondary | ICD-10-CM

## 2016-10-20 DIAGNOSIS — E538 Deficiency of other specified B group vitamins: Secondary | ICD-10-CM | POA: Diagnosis not present

## 2016-11-20 DIAGNOSIS — Z01419 Encounter for gynecological examination (general) (routine) without abnormal findings: Secondary | ICD-10-CM | POA: Diagnosis not present

## 2016-11-20 DIAGNOSIS — E538 Deficiency of other specified B group vitamins: Secondary | ICD-10-CM | POA: Diagnosis not present

## 2016-12-15 DIAGNOSIS — E538 Deficiency of other specified B group vitamins: Secondary | ICD-10-CM | POA: Diagnosis not present

## 2016-12-29 ENCOUNTER — Ambulatory Visit
Admission: RE | Admit: 2016-12-29 | Discharge: 2016-12-29 | Disposition: A | Discharge status: Home / Self Care | Payer: 59 | Source: Ambulatory Visit | Attending: Family Medicine | Admitting: Family Medicine

## 2016-12-29 DIAGNOSIS — Z1231 Encounter for screening mammogram for malignant neoplasm of breast: Secondary | ICD-10-CM

## 2017-01-14 DIAGNOSIS — E538 Deficiency of other specified B group vitamins: Secondary | ICD-10-CM | POA: Diagnosis not present

## 2017-02-16 DIAGNOSIS — E559 Vitamin D deficiency, unspecified: Secondary | ICD-10-CM | POA: Diagnosis not present

## 2017-02-16 DIAGNOSIS — E538 Deficiency of other specified B group vitamins: Secondary | ICD-10-CM | POA: Diagnosis not present

## 2017-02-16 DIAGNOSIS — E7800 Pure hypercholesterolemia, unspecified: Secondary | ICD-10-CM | POA: Diagnosis not present

## 2017-02-16 DIAGNOSIS — Z0000 Encounter for general adult medical examination without abnormal findings: Secondary | ICD-10-CM | POA: Diagnosis not present

## 2017-03-18 DIAGNOSIS — E538 Deficiency of other specified B group vitamins: Secondary | ICD-10-CM | POA: Diagnosis not present

## 2017-04-20 DIAGNOSIS — E538 Deficiency of other specified B group vitamins: Secondary | ICD-10-CM | POA: Diagnosis not present

## 2017-05-19 DIAGNOSIS — E538 Deficiency of other specified B group vitamins: Secondary | ICD-10-CM | POA: Diagnosis not present

## 2017-05-19 DIAGNOSIS — E559 Vitamin D deficiency, unspecified: Secondary | ICD-10-CM | POA: Diagnosis not present

## 2017-07-17 DIAGNOSIS — E538 Deficiency of other specified B group vitamins: Secondary | ICD-10-CM | POA: Diagnosis not present

## 2017-08-14 DIAGNOSIS — E538 Deficiency of other specified B group vitamins: Secondary | ICD-10-CM | POA: Diagnosis not present

## 2017-09-14 DIAGNOSIS — E538 Deficiency of other specified B group vitamins: Secondary | ICD-10-CM | POA: Diagnosis not present

## 2017-09-21 ENCOUNTER — Other Ambulatory Visit: Payer: Self-pay | Admitting: Family Medicine

## 2017-09-21 DIAGNOSIS — Z139 Encounter for screening, unspecified: Secondary | ICD-10-CM

## 2017-10-16 DIAGNOSIS — E538 Deficiency of other specified B group vitamins: Secondary | ICD-10-CM | POA: Diagnosis not present

## 2017-11-13 DIAGNOSIS — E559 Vitamin D deficiency, unspecified: Secondary | ICD-10-CM | POA: Diagnosis not present

## 2017-11-30 ENCOUNTER — Ambulatory Visit
Admission: RE | Admit: 2017-11-30 | Discharge: 2017-11-30 | Disposition: A | Discharge status: Home / Self Care | Payer: 59 | Source: Ambulatory Visit | Attending: Family Medicine | Admitting: Family Medicine

## 2017-11-30 DIAGNOSIS — Z139 Encounter for screening, unspecified: Secondary | ICD-10-CM

## 2017-12-11 DIAGNOSIS — E559 Vitamin D deficiency, unspecified: Secondary | ICD-10-CM | POA: Diagnosis not present

## 2017-12-31 ENCOUNTER — Ambulatory Visit
Admission: RE | Admit: 2017-12-31 | Discharge: 2017-12-31 | Disposition: A | Discharge status: Home / Self Care | Payer: 59 | Source: Ambulatory Visit | Attending: Family Medicine | Admitting: Family Medicine

## 2017-12-31 DIAGNOSIS — Z1231 Encounter for screening mammogram for malignant neoplasm of breast: Secondary | ICD-10-CM | POA: Diagnosis not present

## 2018-01-15 DIAGNOSIS — E538 Deficiency of other specified B group vitamins: Secondary | ICD-10-CM | POA: Diagnosis not present

## 2018-02-17 DIAGNOSIS — E7800 Pure hypercholesterolemia, unspecified: Secondary | ICD-10-CM | POA: Diagnosis not present

## 2018-02-17 DIAGNOSIS — Z0000 Encounter for general adult medical examination without abnormal findings: Secondary | ICD-10-CM | POA: Diagnosis not present

## 2018-02-17 DIAGNOSIS — E538 Deficiency of other specified B group vitamins: Secondary | ICD-10-CM | POA: Diagnosis not present

## 2018-02-17 DIAGNOSIS — D649 Anemia, unspecified: Secondary | ICD-10-CM | POA: Diagnosis not present

## 2018-02-17 DIAGNOSIS — E559 Vitamin D deficiency, unspecified: Secondary | ICD-10-CM | POA: Diagnosis not present

## 2018-03-19 DIAGNOSIS — E538 Deficiency of other specified B group vitamins: Secondary | ICD-10-CM | POA: Diagnosis not present

## 2018-04-19 DIAGNOSIS — E538 Deficiency of other specified B group vitamins: Secondary | ICD-10-CM | POA: Diagnosis not present

## 2018-05-21 DIAGNOSIS — E538 Deficiency of other specified B group vitamins: Secondary | ICD-10-CM | POA: Diagnosis not present

## 2018-06-18 DIAGNOSIS — E538 Deficiency of other specified B group vitamins: Secondary | ICD-10-CM | POA: Diagnosis not present

## 2018-07-20 DIAGNOSIS — E538 Deficiency of other specified B group vitamins: Secondary | ICD-10-CM | POA: Diagnosis not present

## 2018-08-24 DIAGNOSIS — E538 Deficiency of other specified B group vitamins: Secondary | ICD-10-CM | POA: Diagnosis not present

## 2018-09-24 DIAGNOSIS — E538 Deficiency of other specified B group vitamins: Secondary | ICD-10-CM | POA: Diagnosis not present

## 2018-10-25 DIAGNOSIS — E538 Deficiency of other specified B group vitamins: Secondary | ICD-10-CM | POA: Diagnosis not present

## 2018-11-23 DIAGNOSIS — E538 Deficiency of other specified B group vitamins: Secondary | ICD-10-CM | POA: Diagnosis not present

## 2019-01-21 DIAGNOSIS — E538 Deficiency of other specified B group vitamins: Secondary | ICD-10-CM | POA: Diagnosis not present

## 2019-03-09 ENCOUNTER — Other Ambulatory Visit: Payer: Self-pay | Admitting: Family Medicine

## 2019-03-09 DIAGNOSIS — Z1231 Encounter for screening mammogram for malignant neoplasm of breast: Secondary | ICD-10-CM

## 2019-04-06 ENCOUNTER — Other Ambulatory Visit: Payer: Self-pay | Admitting: Family Medicine

## 2019-04-06 ENCOUNTER — Ambulatory Visit
Admission: RE | Admit: 2019-04-06 | Discharge: 2019-04-06 | Disposition: A | Discharge status: Home / Self Care | Payer: 59 | Source: Ambulatory Visit | Attending: Family Medicine | Admitting: Family Medicine

## 2019-04-06 DIAGNOSIS — R0789 Other chest pain: Secondary | ICD-10-CM

## 2019-04-21 ENCOUNTER — Ambulatory Visit
Admission: RE | Admit: 2019-04-21 | Discharge: 2019-04-21 | Disposition: A | Discharge status: Home / Self Care | Payer: 59 | Source: Ambulatory Visit | Attending: Family Medicine | Admitting: Family Medicine

## 2019-04-21 ENCOUNTER — Other Ambulatory Visit: Payer: Self-pay

## 2019-04-21 ENCOUNTER — Other Ambulatory Visit: Payer: Self-pay | Admitting: Family Medicine

## 2019-04-21 DIAGNOSIS — Z1231 Encounter for screening mammogram for malignant neoplasm of breast: Secondary | ICD-10-CM

## 2019-04-21 DIAGNOSIS — N6310 Unspecified lump in the right breast, unspecified quadrant: Secondary | ICD-10-CM

## 2019-04-28 ENCOUNTER — Ambulatory Visit
Admission: RE | Admit: 2019-04-28 | Discharge: 2019-04-28 | Disposition: A | Discharge status: Home / Self Care | Payer: 59 | Source: Ambulatory Visit | Attending: Family Medicine | Admitting: Family Medicine

## 2019-04-28 ENCOUNTER — Other Ambulatory Visit: Payer: Self-pay

## 2019-04-28 DIAGNOSIS — N6310 Unspecified lump in the right breast, unspecified quadrant: Secondary | ICD-10-CM

## 2019-12-20 ENCOUNTER — Other Ambulatory Visit: Payer: Self-pay | Admitting: Chiropractic Medicine

## 2019-12-20 DIAGNOSIS — R52 Pain, unspecified: Secondary | ICD-10-CM

## 2020-01-14 ENCOUNTER — Ambulatory Visit
Admission: RE | Admit: 2020-01-14 | Discharge: 2020-01-14 | Disposition: A | Discharge status: Home / Self Care | Payer: 59 | Source: Ambulatory Visit | Attending: Chiropractic Medicine | Admitting: Chiropractic Medicine

## 2020-01-14 ENCOUNTER — Other Ambulatory Visit: Payer: Self-pay

## 2020-01-14 DIAGNOSIS — R52 Pain, unspecified: Secondary | ICD-10-CM

## 2020-01-14 MED ORDER — GADOBENATE DIMEGLUMINE 529 MG/ML IV SOLN
20.0000 mL | Freq: Once | INTRAVENOUS | Status: AC | PRN
  Administered 2020-01-14: 20 mL via INTRAVENOUS

## 2020-01-19 ENCOUNTER — Other Ambulatory Visit: Payer: Self-pay | Admitting: Chiropractor

## 2020-01-19 DIAGNOSIS — R52 Pain, unspecified: Secondary | ICD-10-CM

## 2020-01-21 ENCOUNTER — Other Ambulatory Visit: Payer: Self-pay

## 2020-01-21 ENCOUNTER — Ambulatory Visit
Admission: RE | Admit: 2020-01-21 | Discharge: 2020-01-21 | Disposition: A | Discharge status: Home / Self Care | Payer: 59 | Source: Ambulatory Visit | Attending: Chiropractor | Admitting: Chiropractor

## 2020-01-21 DIAGNOSIS — R52 Pain, unspecified: Secondary | ICD-10-CM

## 2020-04-10 ENCOUNTER — Ambulatory Visit: Payer: 59 | Attending: Critical Care Medicine

## 2020-04-10 ENCOUNTER — Other Ambulatory Visit: Payer: Self-pay

## 2020-04-10 DIAGNOSIS — Z23 Encounter for immunization: Secondary | ICD-10-CM

## 2020-04-10 DIAGNOSIS — Z20822 Contact with and (suspected) exposure to covid-19: Secondary | ICD-10-CM

## 2020-04-10 NOTE — Progress Notes (Signed)
   Covid-19 Vaccination Clinic  Name:  Sabrina Johnson    MRN: [id number] DOB: 11-14-1960  04/10/2020  Sabrina Johnson was observed post Covid-19 immunization for 15 minutes without incident. She was provided with Vaccine Information Sheet and instruction to access the V-Safe system.   Sabrina Johnson was instructed to call 911 with any severe reactions post vaccine: Marland Kitchen Difficulty breathing  . Swelling of face and throat  . A fast heartbeat  . A bad rash all over body  . Dizziness and weakness   Immunizations Administered    Name Date Dose VIS Date Route   Pfizer COVID-19 Vaccine 04/10/2020  2:48 PM 0.3 mL 11/02/2018 Intramuscular   Manufacturer: [employer], [employer]   Lot: [id number]   NDC: [address]-2

## 2020-04-11 LAB — SARS-COV-2, NAA 2 DAY TAT

## 2020-04-11 LAB — NOVEL CORONAVIRUS, NAA: SARS-CoV-2, NAA: NOT DETECTED

## 2020-04-26 ENCOUNTER — Ambulatory Visit (INDEPENDENT_AMBULATORY_CARE_PROVIDER_SITE_OTHER): Payer: 59 | Admitting: Otolaryngology

## 2020-05-01 ENCOUNTER — Ambulatory Visit: Payer: 59 | Attending: Critical Care Medicine

## 2020-05-01 DIAGNOSIS — Z23 Encounter for immunization: Secondary | ICD-10-CM

## 2020-05-01 NOTE — Progress Notes (Signed)
   Covid-19 Vaccination Clinic  Name:  Sabrina Johnson    MRN: [id number] DOB: 04/02/1961  05/01/2020  [name] was observed post Covid-19 immunization for 15 minutes without incident. She was provided with Vaccine Information Sheet and instruction to access the V-Safe system.   [name] was instructed to call 911 with any severe reactions post vaccine: Marland Kitchen Difficulty breathing  . Swelling of face and throat  . A fast heartbeat  . A bad rash all over body  . Dizziness and weakness   Immunizations Administered    Name Date Dose VIS Date Route   Pfizer COVID-19 Vaccine 05/01/2020 12:08 PM 0.3 mL 11/02/2018 Intramuscular   Manufacturer: [employer], [employer]   Lot: [id number]   NDC: [address]-6

## 2020-09-13 ENCOUNTER — Other Ambulatory Visit: Payer: Self-pay | Admitting: Family Medicine

## 2020-09-13 DIAGNOSIS — Z1231 Encounter for screening mammogram for malignant neoplasm of breast: Secondary | ICD-10-CM

## 2020-10-10 ENCOUNTER — Encounter: Payer: Self-pay | Admitting: Obstetrics & Gynecology

## 2020-10-10 ENCOUNTER — Ambulatory Visit: Payer: 59 | Admitting: Obstetrics & Gynecology

## 2020-10-10 ENCOUNTER — Other Ambulatory Visit: Payer: Self-pay

## 2020-10-10 VITALS — BP 108/80 | HR 90 | Resp 14 | Ht 62.5 in | Wt 229.4 lb

## 2020-10-10 DIAGNOSIS — Z6841 Body Mass Index (BMI) 40.0 and over, adult: Secondary | ICD-10-CM

## 2020-10-10 DIAGNOSIS — Z01419 Encounter for gynecological examination (general) (routine) without abnormal findings: Secondary | ICD-10-CM | POA: Diagnosis not present

## 2020-10-10 DIAGNOSIS — N951 Menopausal and female climacteric states: Secondary | ICD-10-CM

## 2020-10-10 DIAGNOSIS — Z90710 Acquired absence of both cervix and uterus: Secondary | ICD-10-CM

## 2020-10-10 DIAGNOSIS — Z1272 Encounter for screening for malignant neoplasm of vagina: Secondary | ICD-10-CM

## 2020-10-10 MED ORDER — ESTRADIOL 0.05 MG/24HR TD PTWK: 0.0500 mg | MEDICATED_PATCH | TRANSDERMAL | 4 refills | Status: DC

## 2020-10-10 NOTE — Progress Notes (Signed)
Sabrina Johnson 1960/12/31 [id number]   History:    60 y.o.  G1P1L1 Single.  1 grand-son 59 month old.  RP:  New (>3 yrs) patient presenting for annual gyn exam   HPI: S/P Total Hysterectomy.  Started having night sweats x about 5 years.  Currently severe enough to wake her up.  No hot flushes during the day.  Currently abstinent.  Urine/BMs normal.  BMI 41.29.  Needs to increase fitness activities and lower caloric intake.  Breasts normal.  Health labs and Col[place] to schedule through Fam MD.    Past medical history,surgical history, family history and social history were all reviewed and documented in the EPIC chart.  Gynecologic History Patient's last menstrual period was 09/15/2012.  Obstetric History OB History  Gravida Para Term Preterm AB Living  1 1          SAB IAB Ectopic Multiple Live Births               # Outcome Date GA Lbr Len/2nd Weight Sex Delivery Anes PTL Lv  1 Para      CS-[id number]        ROS: A ROS was performed and pertinent positives and negatives are included in the history.  GENERAL: No fevers or chills. HEENT: No change in vision, no earache, sore throat or sinus congestion. NECK: No pain or stiffness. CARDIOVASCULAR: No chest pain or pressure. No palpitations. PULMONARY: No shortness of breath, cough or wheeze. GASTROINTESTINAL: No abdominal pain, nausea, vomiting or diarrhea, melena or bright red blood per rectum. GENITOURINARY: No urinary frequency, urgency, hesitancy or dysuria. MUSCULOSKELETAL: No joint or muscle pain, no back pain, no recent trauma. DERMATOLOGIC: No rash, no itching, no lesions. ENDOCRINE: No polyuria, polydipsia, no heat or cold intolerance. No recent change in weight. HEMATOLOGICAL: No anemia or easy bruising or bleeding. NEUROLOGIC: No headache, seizures, numbness, tingling or weakness. PSYCHIATRIC: No depression, no loss of interest in normal activity or change in sleep pattern.     Exam:   BP 108/80 (BP Location: Right Arm, Patient  Position: Sitting, Cuff Size: Large)   Pulse 90   Resp 14   Ht 5' 2.5" (1.588 m)   Wt 229 lb 6.4 oz (104.1 kg)   LMP 09/15/2012   BMI 41.29 kg/m   Body mass index is 41.29 kg/m.  General appearance : Well developed well nourished female. No acute distress HEENT: Eyes: no retinal hemorrhage or exudates,  Neck supple, trachea midline, no carotid bruits, no thyroidmegaly Lungs: Clear to auscultation, no rhonchi or wheezes, or rib retractions  Heart: Regular rate and rhythm, no murmurs or gallops Breast:Examined in sitting and supine position were symmetrical in appearance, no palpable masses or tenderness,  no skin retraction, no nipple inversion, no nipple discharge, no skin discoloration, no axillary or supraclavicular lymphadenopathy Abdomen: no palpable masses or tenderness, no rebound or guarding Extremities: no edema or skin discoloration or tenderness  Pelvic: Vulva: Normal             Vagina: No gross lesions or discharge.  Pap reflex done.  Cervix/Uterus absent  Adnexa  Without masses or tenderness  Anus: Normal   Assessment/Plan:  60 y.o. female for annual exam   1. Encounter for Papanicolaou smear of vagina as part of routine gynecological examination Gynecologic exam status post total hysterectomy.  Pap reflex done at the vaginal vault.  Breast exam normal.  Screening mammogram is scheduled for February 2022.  Will organize colonoscopy through her family  physician as well as do fasting health labs.    2. S/P total hysterectomy  3. Postmenopausal syndrome Menopausal syndrome with significant night sweats causing insomnia.  Risks/benefits of hormone replacement therapy reviewed.  No contraindication to start on estradiol therapy.  Status post total hysterectomy.  Decision to start with Estradiol patch 0.05 weekly.  Usage reviewed and prescription sent to pharmacy.  4. Class 3 severe obesity due to excess calories without serious comorbidity with body mass index (BMI) of  40.0 to 44.9 in adult [facility]) Recommend a lower calorie/carb diet.  Aerobic activities 5 times a week and light weightlifting every 2 days.  Other orders - Multiple Vitamin (MULTIVITAMIN PO); Take by mouth. - estradiol (CLIMARA - DOSED IN MG/24 HR) 0.05 mg/24hr patch; Place 1 patch (0.05 mg total) onto the skin once a week.  Genia Del MD, 3:02 PM 10/10/2020

## 2020-10-12 LAB — PAP IG W/ RFLX HPV ASCU

## 2020-10-24 ENCOUNTER — Other Ambulatory Visit: Payer: Self-pay

## 2020-10-24 ENCOUNTER — Ambulatory Visit
Admission: RE | Admit: 2020-10-24 | Discharge: 2020-10-24 | Disposition: A | Discharge status: Home / Self Care | Payer: 59 | Source: Ambulatory Visit | Attending: Family Medicine | Admitting: Family Medicine

## 2020-10-24 DIAGNOSIS — Z1231 Encounter for screening mammogram for malignant neoplasm of breast: Secondary | ICD-10-CM

## 2020-11-01 ENCOUNTER — Other Ambulatory Visit: Payer: Self-pay

## 2020-11-01 ENCOUNTER — Encounter (HOSPITAL_COMMUNITY): Payer: Self-pay

## 2020-11-01 ENCOUNTER — Emergency Department (HOSPITAL_COMMUNITY): Payer: 59

## 2020-11-01 ENCOUNTER — Emergency Department (HOSPITAL_COMMUNITY)
Admission: EM | Admit: 2020-11-01 | Discharge: 2020-11-01 | Disposition: A | Discharge status: Home / Self Care | Payer: 59 | Attending: Emergency Medicine | Admitting: Emergency Medicine

## 2020-11-01 DIAGNOSIS — R0781 Pleurodynia: Secondary | ICD-10-CM | POA: Diagnosis not present

## 2020-11-01 DIAGNOSIS — S92324A Nondisplaced fracture of second metatarsal bone, right foot, initial encounter for closed fracture: Secondary | ICD-10-CM | POA: Diagnosis not present

## 2020-11-01 DIAGNOSIS — S8001XA Contusion of right knee, initial encounter: Secondary | ICD-10-CM | POA: Insufficient documentation

## 2020-11-01 DIAGNOSIS — Y92410 Unspecified street and highway as the place of occurrence of the external cause: Secondary | ICD-10-CM | POA: Insufficient documentation

## 2020-11-01 DIAGNOSIS — M542 Cervicalgia: Secondary | ICD-10-CM | POA: Diagnosis not present

## 2020-11-01 DIAGNOSIS — S99921A Unspecified injury of right foot, initial encounter: Secondary | ICD-10-CM | POA: Diagnosis present

## 2020-11-01 DIAGNOSIS — M25569 Pain in unspecified knee: Secondary | ICD-10-CM

## 2020-11-01 LAB — CBC WITH DIFFERENTIAL/PLATELET
Abs Immature Granulocytes: 0.09 10*3/uL — ABNORMAL HIGH (ref 0.00–0.07)
Basophils Absolute: 0 10*3/uL (ref 0.0–0.1)
Basophils Relative: 1 %
Eosinophils Absolute: 0.1 10*3/uL (ref 0.0–0.5)
Eosinophils Relative: 1 %
HCT: 37.1 % (ref 36.0–46.0)
Hemoglobin: 12 g/dL (ref 12.0–15.0)
Immature Granulocytes: 2 %
Lymphocytes Relative: 39 %
Lymphs Abs: 1.9 10*3/uL (ref 0.7–4.0)
MCH: 30.6 pg (ref 26.0–34.0)
MCHC: 32.3 g/dL (ref 30.0–36.0)
MCV: 94.6 fL (ref 80.0–100.0)
Monocytes Absolute: 0.5 10*3/uL (ref 0.1–1.0)
Monocytes Relative: 10 %
Neutro Abs: 2.4 10*3/uL (ref 1.7–7.7)
Neutrophils Relative %: 47 %
Platelets: 190 10*3/uL (ref 150–400)
RBC: 3.92 MIL/uL (ref 3.87–5.11)
RDW: 14.4 % (ref 11.5–15.5)
WBC: 4.9 10*3/uL (ref 4.0–10.5)
nRBC: 0 % (ref 0.0–0.2)

## 2020-11-01 LAB — COMPREHENSIVE METABOLIC PANEL
ALT: 22 U/L (ref 0–44)
AST: 32 U/L (ref 15–41)
Albumin: 3.5 g/dL (ref 3.5–5.0)
Alkaline Phosphatase: 58 U/L (ref 38–126)
Anion gap: 8 (ref 5–15)
BUN: 12 mg/dL (ref 6–20)
CO2: 26 mmol/L (ref 22–32)
Calcium: 8.5 mg/dL — ABNORMAL LOW (ref 8.9–10.3)
Chloride: 107 mmol/L (ref 98–111)
Creatinine, Ser: 0.66 mg/dL (ref 0.44–1.00)
GFR, Estimated: >60 mL/min (ref >60)
Glucose, Bld: 104 mg/dL — ABNORMAL HIGH (ref 70–99)
Potassium: 3.7 mmol/L (ref 3.5–5.1)
Sodium: 141 mmol/L (ref 135–145)
Total Bilirubin: 0.3 mg/dL (ref 0.3–1.2)
Total Protein: 6.5 g/dL (ref 6.5–8.1)

## 2020-11-01 LAB — I-STAT CHEM 8, ED
BUN: 15 mg/dL (ref 6–20)
Calcium, Ion: 1.14 mmol/L — ABNORMAL LOW (ref 1.15–1.40)
Chloride: 106 mmol/L (ref 98–111)
Creatinine, Ser: 0.6 mg/dL (ref 0.44–1.00)
Glucose, Bld: 97 mg/dL (ref 70–99)
HCT: 38 % (ref 36.0–46.0)
Hemoglobin: 12.9 g/dL (ref 12.0–15.0)
Potassium: 3.7 mmol/L (ref 3.5–5.1)
Sodium: 144 mmol/L (ref 135–145)
TCO2: 28 mmol/L (ref 22–32)

## 2020-11-01 LAB — TROPONIN I (HIGH SENSITIVITY): Troponin I (High Sensitivity): 6 ng/L (ref <18)

## 2020-11-01 MED ORDER — OXYCODONE-ACETAMINOPHEN 5-325 MG PO TABS: 2.0000 | ORAL_TABLET | ORAL | 0 refills | Status: DC | PRN

## 2020-11-01 MED ORDER — SODIUM CHLORIDE 0.9 % IV BOLUS
1000.0000 mL | Freq: Once | INTRAVENOUS | Status: AC
  Administered 2020-11-01: 1000 mL via INTRAVENOUS

## 2020-11-01 MED ORDER — METHOCARBAMOL 500 MG PO TABS: 500.0000 mg | ORAL_TABLET | Freq: Two times a day (BID) | ORAL | 0 refills | Status: DC

## 2020-11-01 MED ORDER — IOHEXOL 300 MG/ML  SOLN
100.0000 mL | Freq: Once | INTRAMUSCULAR | Status: AC | PRN
  Administered 2020-11-01: 100 mL via INTRAVENOUS

## 2020-11-01 MED ORDER — HYDROMORPHONE HCL 1 MG/ML IJ SOLN
0.5000 mg | Freq: Once | INTRAMUSCULAR | Status: AC
  Administered 2020-11-01: 0.5 mg via INTRAVENOUS
  Filled 2020-11-01: qty 1

## 2020-11-01 MED ORDER — FENTANYL CITRATE (PF) 100 MCG/2ML IJ SOLN
50.0000 ug | Freq: Once | INTRAMUSCULAR | Status: AC
  Administered 2020-11-01: 50 ug via INTRAVENOUS
  Filled 2020-11-01: qty 2

## 2020-11-01 MED ORDER — OXYCODONE-ACETAMINOPHEN 5-325 MG PO TABS
2.0000 | ORAL_TABLET | Freq: Once | ORAL | Status: AC
Start: 2020-11-01 — End: 2020-11-01
  Administered 2020-11-01: 2 via ORAL
  Filled 2020-11-01: qty 2

## 2020-11-01 MED ORDER — ONDANSETRON HCL 4 MG/2ML IJ SOLN
4.0000 mg | Freq: Once | INTRAMUSCULAR | Status: AC
  Administered 2020-11-01: 4 mg via INTRAVENOUS
  Filled 2020-11-01: qty 2

## 2020-11-01 NOTE — ED Notes (Signed)
[facility] called  
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
## 2020-11-01 NOTE — ED Notes (Signed)
Pt given discharged paper work. PIV removed. Vitals taken. Transported on stretcher and given to [facility] for transport.

## 2020-11-01 NOTE — Progress Notes (Signed)
Orthopedic Tech Progress Note Patient Details:  Sabrina Johnson [birth year] [id number]  Ortho Devices Type of Ortho Device: Postop shoe/boot,Crutches Ortho Device/Splint Location: Right Foot Ortho Device/Splint Interventions: Application,Adjustment   Post Interventions Patient Tolerated: Well Instructions Provided: Poper ambulation with device   Aunesti Pellegrino E Elvis Boot 11/01/2020, 8:13 PM
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
## 2020-11-02 ENCOUNTER — Ambulatory Visit: Payer: Self-pay | Admitting: Obstetrics & Gynecology

## 2020-11-02 ENCOUNTER — Telehealth: Payer: Self-pay | Admitting: *Deleted

## 2020-11-02 DIAGNOSIS — S93324A Dislocation of tarsometatarsal joint of right foot, initial encounter: Secondary | ICD-10-CM

## 2020-11-02 DIAGNOSIS — S92901A Unspecified fracture of right foot, initial encounter for closed fracture: Secondary | ICD-10-CM

## 2020-11-02 HISTORY — DX: Dislocation of tarsometatarsal joint of right foot, initial encounter: S93.324A

## 2020-11-02 NOTE — Telephone Encounter (Signed)
    Durable Medical Equipment  (From admission, onward)         Start     Ordered   11/02/20 0000  For home use only DME standard manual wheelchair with seat cushion       Comments: Patient suffers from broken foot which impairs their ability to perform daily activities like toileting in the home.  A walker will not resolve issue with performing activities of daily living. A wheelchair will allow patient to safely perform daily activities. Patient can safely propel the wheelchair in the home or has a caregiver who can provide assistance. Length of need 6 months . Accessories: elevating leg rests (ELRs), wheel locks, extensions and anti-tippers.   11/02/20 1016

## 2020-11-02 NOTE — Discharge Planning (Signed)
    Durable Medical Equipment  (From admission, onward)         Start     Ordered   11/01/20 0000  For home use only DME wheelchair cushion (seat and back)        11/01/20 2138

## 2020-11-02 NOTE — Telephone Encounter (Signed)
Oletta Cohn, RN, BSN, [place] [phone] Pt qualifies for DME wheelchair.  DME  ordered through [employer].  Velna Hatchet of [employer] notified to deliver DME to pt to pt home.

## 2020-11-05 ENCOUNTER — Other Ambulatory Visit: Payer: Self-pay | Admitting: Orthopedic Surgery

## 2020-11-05 ENCOUNTER — Ambulatory Visit
Admission: RE | Admit: 2020-11-05 | Discharge: 2020-11-05 | Disposition: A | Discharge status: Home / Self Care | Payer: 59 | Source: Ambulatory Visit | Attending: Orthopedic Surgery | Admitting: Orthopedic Surgery

## 2020-11-05 DIAGNOSIS — M79671 Pain in right foot: Secondary | ICD-10-CM

## 2020-11-09 ENCOUNTER — Other Ambulatory Visit (HOSPITAL_COMMUNITY): Payer: Self-pay | Admitting: Orthopedic Surgery

## 2020-11-12 ENCOUNTER — Other Ambulatory Visit: Payer: Self-pay

## 2020-11-12 ENCOUNTER — Encounter (HOSPITAL_COMMUNITY): Payer: Self-pay | Admitting: Orthopedic Surgery

## 2020-11-12 NOTE — Progress Notes (Signed)
COVID Vaccine Completed: Yes Date COVID Vaccine completed: 05/01/20 Has received booster: Yes COVID vaccine manufacturer: Pfizer    Date of COVID positive in last 90 days: N/A  PCP - Johny Blamer, MD Cardiologist - N/A  Chest x-ray - 11/01/20 in epic EKG - 11/01/20 in epic Stress Test -  N/A ECHO - N/A Cardiac Cath - N/A Pacemaker/ICD device last checked: N/A  Sleep Study - N/A CPAP -N/A   Fasting Blood Sugar - N/A Checks Blood Sugar _N/A____ times a day  Blood Thinner Instructions:  N/A Aspirin Instructions:N/A Last Dose: N/A  Activity level: Able to exercise without symptoms     Anesthesia review: N/A  Patient denies shortness of breath, fever, cough and chest pain at PAT appointment   Patient verbalized understanding of instructions that were given to them at the PAT appointment. Patient was also instructed that they will need to review over the PAT instructions again at home before surgery.

## 2020-11-13 ENCOUNTER — Inpatient Hospital Stay (HOSPITAL_COMMUNITY): Payer: 59 | Admitting: Certified Registered Nurse Anesthetist

## 2020-11-13 ENCOUNTER — Inpatient Hospital Stay (HOSPITAL_COMMUNITY)
Admission: RE | Admit: 2020-11-13 | Discharge: 2020-11-15 | DRG: 505 | Disposition: A | Discharge status: Skilled Nursing Facility | Payer: 59 | Attending: Orthopedic Surgery | Admitting: Orthopedic Surgery

## 2020-11-13 ENCOUNTER — Other Ambulatory Visit: Payer: Self-pay

## 2020-11-13 ENCOUNTER — Encounter (HOSPITAL_COMMUNITY)
Admission: RE | Disposition: A | Discharge status: Skilled Nursing Facility | Payer: Self-pay | Source: Home / Self Care | Attending: Orthopedic Surgery

## 2020-11-13 ENCOUNTER — Encounter (HOSPITAL_COMMUNITY): Payer: Self-pay | Admitting: Orthopedic Surgery

## 2020-11-13 DIAGNOSIS — S93324A Dislocation of tarsometatarsal joint of right foot, initial encounter: Secondary | ICD-10-CM | POA: Diagnosis present

## 2020-11-13 DIAGNOSIS — Z20822 Contact with and (suspected) exposure to covid-19: Secondary | ICD-10-CM | POA: Diagnosis present

## 2020-11-13 DIAGNOSIS — Z90710 Acquired absence of both cervix and uterus: Secondary | ICD-10-CM | POA: Diagnosis not present

## 2020-11-13 DIAGNOSIS — Z9884 Bariatric surgery status: Secondary | ICD-10-CM

## 2020-11-13 DIAGNOSIS — S92241A Displaced fracture of medial cuneiform of right foot, initial encounter for closed fracture: Secondary | ICD-10-CM | POA: Diagnosis present

## 2020-11-13 DIAGNOSIS — S93326A Dislocation of tarsometatarsal joint of unspecified foot, initial encounter: Secondary | ICD-10-CM | POA: Diagnosis present

## 2020-11-13 HISTORY — DX: Dislocation of tarsometatarsal joint of right foot, initial encounter: S93.324A

## 2020-11-13 HISTORY — PX: OPEN REDUCTION INTERNAL FIXATION (ORIF) FOOT LISFRANC FRACTURE: SHX5990

## 2020-11-13 LAB — SARS CORONAVIRUS 2 BY RT PCR (HOSPITAL ORDER, PERFORMED IN ~~LOC~~ HOSPITAL LAB): SARS Coronavirus 2: NEGATIVE

## 2020-11-13 SURGERY — OPEN REDUCTION INTERNAL FIXATION (ORIF) FOOT LISFRANC FRACTURE
Anesthesia: General | Site: Foot | Laterality: Right

## 2020-11-13 MED ORDER — OXYCODONE HCL 5 MG PO TABS
10.0000 mg | ORAL_TABLET | ORAL | Status: DC | PRN
  Administered 2020-11-13: 10 mg via ORAL
  Administered 2020-11-14 (×3): 15 mg via ORAL
  Administered 2020-11-14: 10 mg via ORAL
  Administered 2020-11-15: 15 mg via ORAL
  Administered 2020-11-15: 10 mg via ORAL
  Filled 2020-11-13: qty 2
  Filled 2020-11-13 (×4): qty 3
  Filled 2020-11-13: qty 2

## 2020-11-13 MED ORDER — CEFAZOLIN SODIUM-DEXTROSE 2-4 GM/100ML-% IV SOLN
2.0000 g | INTRAVENOUS | Status: AC
  Administered 2020-11-13: 2 g via INTRAVENOUS
  Filled 2020-11-13: qty 100

## 2020-11-13 MED ORDER — EPHEDRINE 5 MG/ML INJ
INTRAVENOUS | Status: AC
  Filled 2020-11-13: qty 10

## 2020-11-13 MED ORDER — VANCOMYCIN HCL 1000 MG IV SOLR
INTRAVENOUS | Status: DC | PRN
  Administered 2020-11-13: 1000 mg via TOPICAL

## 2020-11-13 MED ORDER — ONDANSETRON HCL 4 MG/2ML IJ SOLN: 4.0000 mg | Freq: Four times a day (QID) | INTRAMUSCULAR | Status: DC | PRN

## 2020-11-13 MED ORDER — OXYCODONE HCL 5 MG PO TABS
5.0000 mg | ORAL_TABLET | ORAL | Status: DC | PRN
  Filled 2020-11-13 (×2): qty 2
  Filled 2020-11-13: qty 1

## 2020-11-13 MED ORDER — DEXAMETHASONE SODIUM PHOSPHATE 10 MG/ML IJ SOLN
INTRAMUSCULAR | Status: AC
  Filled 2020-11-13: qty 1

## 2020-11-13 MED ORDER — LACTATED RINGERS IV SOLN: INTRAVENOUS | Status: DC

## 2020-11-13 MED ORDER — ESTRADIOL 0.05 MG/24HR TD PTWK: 0.0500 mg | MEDICATED_PATCH | TRANSDERMAL | Status: DC

## 2020-11-13 MED ORDER — EPHEDRINE SULFATE-NACL 50-0.9 MG/10ML-% IV SOSY
PREFILLED_SYRINGE | INTRAVENOUS | Status: DC | PRN
  Administered 2020-11-13: 5 mg via INTRAVENOUS
  Administered 2020-11-13: 10 mg via INTRAVENOUS

## 2020-11-13 MED ORDER — LIDOCAINE 2% (20 MG/ML) 5 ML SYRINGE
INTRAMUSCULAR | Status: AC
  Filled 2020-11-13: qty 5

## 2020-11-13 MED ORDER — ONDANSETRON HCL 4 MG/2ML IJ SOLN
INTRAMUSCULAR | Status: DC | PRN
  Administered 2020-11-13: 4 mg via INTRAVENOUS

## 2020-11-13 MED ORDER — ROCURONIUM BROMIDE 10 MG/ML (PF) SYRINGE
PREFILLED_SYRINGE | INTRAVENOUS | Status: AC
  Filled 2020-11-13: qty 10

## 2020-11-13 MED ORDER — METHOCARBAMOL 500 MG IVPB - SIMPLE MED
INTRAVENOUS | Status: AC
  Filled 2020-11-13: qty 50

## 2020-11-13 MED ORDER — VANCOMYCIN HCL 1000 MG IV SOLR
INTRAVENOUS | Status: AC
  Filled 2020-11-13: qty 1000

## 2020-11-13 MED ORDER — ACETAMINOPHEN 325 MG PO TABS
325.0000 mg | ORAL_TABLET | Freq: Four times a day (QID) | ORAL | Status: DC | PRN
  Administered 2020-11-14 – 2020-11-15 (×3): 650 mg via ORAL
  Filled 2020-11-13 (×3): qty 2

## 2020-11-13 MED ORDER — ROPIVACAINE HCL 7.5 MG/ML IJ SOLN
INTRAMUSCULAR | Status: DC | PRN
  Administered 2020-11-13: 20 mL via PERINEURAL

## 2020-11-13 MED ORDER — OXYCODONE HCL 5 MG PO TABS: 5.0000 mg | ORAL_TABLET | Freq: Once | ORAL | Status: DC | PRN

## 2020-11-13 MED ORDER — SCOPOLAMINE 1 MG/3DAYS TD PT72: 1.0000 | MEDICATED_PATCH | TRANSDERMAL | Status: DC

## 2020-11-13 MED ORDER — PROMETHAZINE HCL 25 MG/ML IJ SOLN: 6.2500 mg | INTRAMUSCULAR | Status: DC | PRN

## 2020-11-13 MED ORDER — DEXMEDETOMIDINE (PRECEDEX) IN NS 20 MCG/5ML (4 MCG/ML) IV SYRINGE
PREFILLED_SYRINGE | INTRAVENOUS | Status: DC | PRN
  Administered 2020-11-13: 20 ug via INTRAVENOUS

## 2020-11-13 MED ORDER — HYDROMORPHONE HCL 1 MG/ML IJ SOLN
INTRAMUSCULAR | Status: AC
  Filled 2020-11-13: qty 1

## 2020-11-13 MED ORDER — HYDROMORPHONE HCL 1 MG/ML IJ SOLN
0.2500 mg | INTRAMUSCULAR | Status: DC | PRN
  Administered 2020-11-13 (×2): 0.5 mg via INTRAVENOUS

## 2020-11-13 MED ORDER — SODIUM CHLORIDE 0.9 % IR SOLN
Status: DC | PRN
  Administered 2020-11-13: 1000 mL

## 2020-11-13 MED ORDER — PROPOFOL 10 MG/ML IV BOLUS
INTRAVENOUS | Status: AC
  Filled 2020-11-13: qty 20

## 2020-11-13 MED ORDER — DEXAMETHASONE SODIUM PHOSPHATE 10 MG/ML IJ SOLN
INTRAMUSCULAR | Status: DC | PRN
  Administered 2020-11-13: 8 mg via INTRAVENOUS

## 2020-11-13 MED ORDER — MEPERIDINE HCL 50 MG/ML IJ SOLN: 6.2500 mg | INTRAMUSCULAR | Status: DC | PRN

## 2020-11-13 MED ORDER — SODIUM CHLORIDE 0.9 % IV SOLN: INTRAVENOUS | Status: DC

## 2020-11-13 MED ORDER — METHOCARBAMOL 500 MG IVPB - SIMPLE MED
500.0000 mg | Freq: Four times a day (QID) | INTRAVENOUS | Status: DC | PRN
  Administered 2020-11-13: 500 mg via INTRAVENOUS
  Filled 2020-11-13: qty 50

## 2020-11-13 MED ORDER — LIDOCAINE 2% (20 MG/ML) 5 ML SYRINGE
INTRAMUSCULAR | Status: DC | PRN
  Administered 2020-11-13: 100 mg via INTRAVENOUS

## 2020-11-13 MED ORDER — HYDROMORPHONE HCL 1 MG/ML IJ SOLN
0.5000 mg | INTRAMUSCULAR | Status: DC | PRN
  Administered 2020-11-14: 1 mg via INTRAVENOUS
  Filled 2020-11-13: qty 1

## 2020-11-13 MED ORDER — BUPIVACAINE-EPINEPHRINE (PF) 0.5% -1:200000 IJ SOLN
INTRAMUSCULAR | Status: DC | PRN
  Administered 2020-11-13: 30 mL via PERINEURAL

## 2020-11-13 MED ORDER — METHOCARBAMOL 500 MG PO TABS
500.0000 mg | ORAL_TABLET | Freq: Four times a day (QID) | ORAL | Status: DC | PRN
  Administered 2020-11-14 – 2020-11-15 (×4): 500 mg via ORAL
  Filled 2020-11-13 (×4): qty 1

## 2020-11-13 MED ORDER — ONDANSETRON HCL 4 MG/2ML IJ SOLN
INTRAMUSCULAR | Status: AC
  Filled 2020-11-13: qty 2

## 2020-11-13 MED ORDER — OXYCODONE HCL 5 MG/5ML PO SOLN: 5.0000 mg | Freq: Once | ORAL | Status: DC | PRN

## 2020-11-13 MED ORDER — PROPOFOL 10 MG/ML IV BOLUS
INTRAVENOUS | Status: DC | PRN
Start: 2020-11-13 — End: 2020-11-13
  Administered 2020-11-13: 50 mg via INTRAVENOUS
  Administered 2020-11-13: 200 mg via INTRAVENOUS

## 2020-11-13 MED ORDER — ENOXAPARIN SODIUM 40 MG/0.4ML ~~LOC~~ SOLN
40.0000 mg | SUBCUTANEOUS | Status: DC
  Administered 2020-11-14 – 2020-11-15 (×2): 40 mg via SUBCUTANEOUS
  Filled 2020-11-13 (×2): qty 0.4

## 2020-11-13 MED ORDER — ONDANSETRON HCL 4 MG PO TABS: 4.0000 mg | ORAL_TABLET | Freq: Four times a day (QID) | ORAL | Status: DC | PRN

## 2020-11-13 MED ORDER — MIDAZOLAM HCL 2 MG/2ML IJ SOLN
0.5000 mg | Freq: Once | INTRAMUSCULAR | Status: DC | PRN
Start: 2020-11-13 — End: 2020-11-13

## 2020-11-13 MED ORDER — FENTANYL CITRATE (PF) 100 MCG/2ML IJ SOLN
50.0000 ug | INTRAMUSCULAR | Status: DC
  Administered 2020-11-13: 100 ug via INTRAVENOUS
  Filled 2020-11-13: qty 2

## 2020-11-13 MED ORDER — MIDAZOLAM HCL 2 MG/2ML IJ SOLN
1.0000 mg | INTRAMUSCULAR | Status: DC
  Administered 2020-11-13: 2 mg via INTRAVENOUS
  Filled 2020-11-13: qty 2

## 2020-11-13 MED ORDER — DIPHENHYDRAMINE HCL 12.5 MG/5ML PO ELIX: 12.5000 mg | ORAL_SOLUTION | ORAL | Status: DC | PRN

## 2020-11-13 MED ORDER — DOCUSATE SODIUM 100 MG PO CAPS
100.0000 mg | ORAL_CAPSULE | Freq: Two times a day (BID) | ORAL | Status: DC
  Administered 2020-11-13 – 2020-11-15 (×4): 100 mg via ORAL
  Filled 2020-11-13 (×4): qty 1

## 2020-11-13 MED ORDER — ORAL CARE MOUTH RINSE: 15.0000 mL | Freq: Once | OROMUCOSAL | Status: AC

## 2020-11-13 MED ORDER — SENNA 8.6 MG PO TABS
1.0000 | ORAL_TABLET | Freq: Two times a day (BID) | ORAL | Status: DC
  Administered 2020-11-13 – 2020-11-15 (×4): 8.6 mg via ORAL
  Filled 2020-11-13 (×4): qty 1

## 2020-11-13 MED ORDER — CHLORHEXIDINE GLUCONATE 0.12 % MT SOLN
15.0000 mL | Freq: Once | OROMUCOSAL | Status: AC
  Administered 2020-11-13: 15 mL via OROMUCOSAL

## 2020-11-13 MED ORDER — ACETAMINOPHEN 500 MG PO TABS
1000.0000 mg | ORAL_TABLET | Freq: Once | ORAL | Status: AC
  Administered 2020-11-13: 1000 mg via ORAL
  Filled 2020-11-13: qty 2

## 2020-11-13 SURGICAL SUPPLY — 73 items
ANCHOR SUT KEITH ABD SZ2 STR (SUTURE) IMPLANT
BAG ZIPLOCK 12X15 (MISCELLANEOUS) ×2 IMPLANT
BIT DRILL 2.9 CANN QC NONSTRL (BIT) ×2 IMPLANT
BIT DRILL CAL 2.5 ST W/SLV (BIT) ×2 IMPLANT
BNDG COHESIVE 4X5 TAN STRL (GAUZE/BANDAGES/DRESSINGS) ×2 IMPLANT
BNDG COHESIVE 6X5 TAN STRL LF (GAUZE/BANDAGES/DRESSINGS) ×2 IMPLANT
BNDG ELASTIC 4X5.8 VLCR STR LF (GAUZE/BANDAGES/DRESSINGS) ×2 IMPLANT
BNDG ELASTIC 6X5.8 VLCR STR LF (GAUZE/BANDAGES/DRESSINGS) ×2 IMPLANT
BNDG GAUZE ELAST 4 BULKY (GAUZE/BANDAGES/DRESSINGS) ×4 IMPLANT
COVER SURGICAL LIGHT HANDLE (MISCELLANEOUS) ×2 IMPLANT
COVER WAND RF STERILE (DRAPES) IMPLANT
CUFF TOURN SGL QUICK 34 (TOURNIQUET CUFF) ×2
CUFF TRNQT CYL 34X4.125X (TOURNIQUET CUFF) ×1 IMPLANT
DECANTER SPIKE VIAL GLASS SM (MISCELLANEOUS) IMPLANT
DRAPE C-ARM 42X120 X-RAY (DRAPES) ×2 IMPLANT
DRAPE OEC MINIVIEW 54X84 (DRAPES) ×2 IMPLANT
DRAPE ORTHO SPLIT 77X108 STRL (DRAPES) ×2
DRAPE SURG ORHT 6 SPLT 77X108 (DRAPES) ×2 IMPLANT
DRAPE U-SHAPE 47X51 STRL (DRAPES) ×2 IMPLANT
DRSG ADAPTIC 3X8 NADH LF (GAUZE/BANDAGES/DRESSINGS) ×2 IMPLANT
DRSG EMULSION OIL 3X3 NADH (GAUZE/BANDAGES/DRESSINGS) ×2 IMPLANT
DRSG PAD ABDOMINAL 8X10 ST (GAUZE/BANDAGES/DRESSINGS) ×2 IMPLANT
DURAPREP 26ML APPLICATOR (WOUND CARE) ×2 IMPLANT
ELECT REM PT RETURN 15FT ADLT (MISCELLANEOUS) ×2 IMPLANT
FACESHIELD WRAPAROUND (MASK) ×2 IMPLANT
GAUZE SPONGE 4X4 12PLY STRL (GAUZE/BANDAGES/DRESSINGS) ×2 IMPLANT
GLOVE SRG 8 PF TXTR STRL LF DI (GLOVE) ×1 IMPLANT
GLOVE SURG ENC MOIS LTX SZ8 (GLOVE) ×2 IMPLANT
GLOVE SURG ORTHO LTX SZ8.5 (GLOVE) ×2 IMPLANT
GLOVE SURG UNDER POLY LF SZ8 (GLOVE) ×2
K-WIRE ACE 1.6X6 (WIRE) ×2
KIT BASIN OR (CUSTOM PROCEDURE TRAY) ×2 IMPLANT
KIT STRATUM INSTRUMENT STD (KITS) ×2 IMPLANT
KIT TURNOVER KIT A (KITS) ×2 IMPLANT
KWIRE ACE 1.6X6 (WIRE) ×1 IMPLANT
NEEDLE HYPO 22GX1.5 SAFETY (NEEDLE) IMPLANT
NEEDLE MAYO CATGUT SZ4 (NEEDLE) ×2 IMPLANT
NS IRRIG 1000ML POUR BTL (IV SOLUTION) ×2 IMPLANT
PACK ORTHO EXTREMITY (CUSTOM PROCEDURE TRAY) ×2 IMPLANT
PAD CAST 3X4 CTTN HI CHSV (CAST SUPPLIES) ×1 IMPLANT
PAD CAST 4YDX4 CTTN HI CHSV (CAST SUPPLIES) ×2 IMPLANT
PADDING CAST ABS 6INX4YD NS (CAST SUPPLIES) ×1
PADDING CAST ABS COTTON 6X4 NS (CAST SUPPLIES) ×1 IMPLANT
PADDING CAST COTTON 3X4 STRL (CAST SUPPLIES) ×2
PADDING CAST COTTON 4X4 STRL (CAST SUPPLIES) ×4
PADDING CAST COTTON 6X4 STRL (CAST SUPPLIES) ×2 IMPLANT
PENCIL SMOKE EVACUATOR (MISCELLANEOUS) IMPLANT
PLATE SNGL LISFRANC HOLE 3 (Plate) ×2 IMPLANT
PROTECTOR NERVE ULNAR (MISCELLANEOUS) ×2 IMPLANT
SCREW  STRATUM NL LP ST 3.5X18 (Screw) ×2 IMPLANT
SCREW  STRATUM NL LP ST 3.5X22 (Screw) ×2 IMPLANT
SCREW CANC FT 4X38 (Screw) ×2 IMPLANT
SCREW CANC T15 FT 38X4XST TIP (Screw) ×1 IMPLANT
SCREW LOCK ST STRM 3.5X26 (Screw) ×2 IMPLANT
SCREW NL LP STRATUM 3.5X30 (Screw) ×2 IMPLANT
SCREW NLOCK CANC HEX 4X32 (Screw) ×2 IMPLANT
SCREW STRATUM NL LP ST 3.5X18 (Screw) ×1 IMPLANT
SCREW STRATUM NL LP ST 3.5X22 (Screw) ×1 IMPLANT
SPLINT PLASTER CAST XFAST 5X30 (CAST SUPPLIES) ×1 IMPLANT
SPLINT PLASTER XFAST SET 5X30 (CAST SUPPLIES) ×1
SPONGE LAP 18X18 RF (DISPOSABLE) ×2 IMPLANT
SPONGE LAP 4X18 RFD (DISPOSABLE) ×4 IMPLANT
STAPLER VISISTAT 35W (STAPLE) ×2 IMPLANT
STRIP CLOSURE SKIN 1/2X4 (GAUZE/BANDAGES/DRESSINGS) ×2 IMPLANT
SUCTION FRAZIER HANDLE 10FR (MISCELLANEOUS) ×2
SUCTION TUBE FRAZIER 10FR DISP (MISCELLANEOUS) ×1 IMPLANT
SUT MNCRL AB 4-0 PS2 18 (SUTURE) IMPLANT
SUT PROLENE 3 0 PS 2 (SUTURE) ×4 IMPLANT
SUT VIC AB 2-0 CT1 27 (SUTURE) ×4
SUT VIC AB 2-0 CT1 TAPERPNT 27 (SUTURE) ×2 IMPLANT
SUT VIC AB 3-0 PS2 18 (SUTURE) ×2
SUT VIC AB 3-0 PS2 18XBRD (SUTURE) ×1 IMPLANT
SYR CONTROL 10ML LL (SYRINGE) ×2 IMPLANT

## 2020-11-13 NOTE — Anesthesia Postprocedure Evaluation (Signed)
Anesthesia Post Note  Patient: ARABELLE BOLLIG  Procedure(s) Performed: OPEN REDUCTION INTERNAL FIXATION (ORIF) FOOT LISFRANC FRACTURE (Right Foot)     Patient location during evaluation: PACU Anesthesia Type: General Level of consciousness: sedated, oriented and patient cooperative Pain management: pain level controlled (pt states she has no pain) Vital Signs Assessment: post-procedure vital signs reviewed and stable Respiratory status: spontaneous breathing, nonlabored ventilation, respiratory function stable and patient connected to nasal cannula oxygen Cardiovascular status: blood pressure returned to baseline and stable Postop Assessment: no apparent nausea or vomiting Anesthetic complications: no   No complications documented.  Last Vitals:  Vitals:   11/13/20 1600 11/13/20 1615  BP: (!) 122/57 (!) 124/59  Pulse: 69 66  Resp: 10 (!) 9  Temp:    SpO2: 100% 100%    Last Pain:  Vitals:   11/13/20 1600  TempSrc:   PainSc: Asleep                 Wynton Hufstetler,E. Nakiya Rallis

## 2020-11-13 NOTE — Discharge Instructions (Signed)
Sabrina Hewitt, MD [facility]  Please read the following information regarding your care after surgery.  Medications  You only need a prescription for the narcotic pain medicine (ex. oxycodone, Percocet, Norco).  All of the other medicines listed below are available over the counter. X Aleve 2 pills twice a day for the first 3 days after surgery. X acetominophen (Tylenol) 650 mg every 4-6 hours as you need for minor to moderate pain X oxycodone as prescribed for severe pain  Narcotic pain medicine (ex. oxycodone, Percocet, Vicodin) will cause constipation.  To prevent this problem, take the following medicines while you are taking any pain medicine. X docusate sodium (Colace) 100 mg twice a day X senna (Senokot) 2 tablets twice a day  X To help prevent blood clots, take Xarelto as prescribed for two weeks after surgery.  You should also get up every hour while you are awake to move around.    Weight Bearing X Do not bear any weight on the operated leg or foot.  Cast / Splint / Dressing X Keep your splint, cast or dressing clean and dry.  Don't put anything (coat hanger, pencil, etc) down inside of it.  If it gets damp, use a hair dryer on the cool setting to dry it.  If it gets soaked, call the office to schedule an appointment for a cast change.   After your dressing, cast or splint is removed; you may shower, but do not soak or scrub the wound.  Allow the water to run over it, and then gently pat it dry.  Swelling It is normal for you to have swelling where you had surgery.  To reduce swelling and pain, keep your toes above your nose for at least 3 days after surgery.  It may be necessary to keep your foot or leg elevated for several weeks.  If it hurts, it should be elevated.  Follow Up Call my office at [phone] when you are discharged from the hospital or surgery center to schedule an appointment to be seen two weeks after surgery.  Call my office at [phone] if you develop  a fever >101.5 F, nausea, vomiting, bleeding from the surgical site or severe pain.     

## 2020-11-13 NOTE — Anesthesia Procedure Notes (Signed)
Anesthesia Regional Block: Adductor canal block   Pre-Anesthetic Checklist: ,, timeout performed, Correct Patient, Correct Site, Correct Laterality, Correct Procedure, Correct Position, site marked, Risks and benefits discussed,  Surgical consent,  Pre-op evaluation,  At surgeon's request and post-op pain management  Laterality: Right and Lower  Prep: chloraprep       Needles:  Injection technique: Single-shot  Needle Type: Echogenic Needle     Needle Length: 9cm  Needle Gauge: 21     Additional Needles:   Procedures:,,,, ultrasound used (permanent image in chart),,,,  Narrative:  Start time: 11/13/2020 12:13 PM End time: 11/13/2020 12:19 PM Injection made incrementally with aspirations every 5 mL.  Performed by: Personally  Anesthesiologist: Jairo Ben, MD  Additional Notes: Pt identified in Holding room.  Monitors applied. Working IV access confirmed. Sterile prep R thigh.  #21ga ECHOgenic Arrow block needle into adductor canal with US guidance.  20cc 0.75% Ropivacaine injected incrementally after negative test dose.  Patient asymptomatic, VSS, no heme aspirated, tolerated well.  Sandford Craze, MD

## 2020-11-13 NOTE — Anesthesia Procedure Notes (Signed)
Anesthesia Regional Block: Popliteal block   Pre-Anesthetic Checklist: ,, timeout performed, Correct Patient, Correct Site, Correct Laterality, Correct Procedure, Correct Position, site marked, Risks and benefits discussed,  Surgical consent,  Pre-op evaluation,  At surgeon's request and post-op pain management  Laterality: Right and Lower  Prep: chloraprep       Needles:  Injection technique: Single-shot  Needle Type: Echogenic Needle     Needle Length: 9cm  Needle Gauge: 21     Additional Needles:   Procedures:,,,, ultrasound used (permanent image in chart),,,,  Narrative:  Start time: 11/13/2020 12:20 PM End time: 11/13/2020 12:24 PM Injection made incrementally with aspirations every 5 mL.  Performed by: Personally  Anesthesiologist: Jairo Ben, MD  Additional Notes: Pt identified in Holding room.  Monitors applied. Working IV access confirmed. Sterile prep R lateral knee.  #21ga ECHOgenic Arrow block needle to sciatic nerve at split in pop fossa with US guidance.  30cc 0.5% Bupivacaine with 1:200k epi injected incrementally after negative test dose.  Patient asymptomatic, VSS, no heme aspirated, tolerated well.  Sandford Craze, MD
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
## 2020-11-13 NOTE — Transfer of Care (Signed)
Immediate Anesthesia Transfer of Care Note  Patient: Sabrina Johnson  Procedure(s) Performed: OPEN REDUCTION INTERNAL FIXATION (ORIF) FOOT LISFRANC FRACTURE (Right Foot)  Patient Location: PACU  Anesthesia Type:GA combined with regional for post-op pain  Level of Consciousness: drowsy  Airway & Oxygen Therapy: Patient Spontanous Breathing and Patient connected to nasal cannula oxygen  Post-op Assessment: Report given to RN and Post -op Vital signs reviewed and stable  Post vital signs: Reviewed and stable  Last Vitals:  Vitals Value Taken Time  BP    Temp    Pulse 86 11/13/20 1437  Resp 15 11/13/20 1437  SpO2 99 % 11/13/20 1437  Vitals shown include unvalidated device data.  Last Pain:  Vitals:   11/13/20 1215  TempSrc:   PainSc: 0-No pain         Complications: No complications documented.

## 2020-11-13 NOTE — Anesthesia Procedure Notes (Signed)
Procedure Name: LMA Insertion °Performed by: Khalis Hittle H, CRNA °Pre-anesthesia Checklist: Patient identified, Emergency Drugs available, Suction available and Patient being monitored °Patient Re-evaluated:Patient Re-evaluated prior to induction °Oxygen Delivery Method: Circle System Utilized °Preoxygenation: Pre-oxygenation with 100% oxygen °Induction Type: IV induction °Ventilation: Mask ventilation without difficulty °LMA: LMA inserted °LMA Size: 4.0 °Number of attempts: 1 °Airway Equipment and Method: Bite block °Placement Confirmation: positive ETCO2 °Tube secured with: Tape °Dental Injury: Teeth and Oropharynx as per pre-operative assessment  ° ° ° ° ° ° °

## 2020-11-13 NOTE — Progress Notes (Signed)
AssistedDr. Jairo Ben with right, ultrasound guided, popliteal, adductor canal block. Side rails up, monitors on throughout procedure. See vital signs in flow sheet. Tolerated Procedure well.

## 2020-11-13 NOTE — Anesthesia Preprocedure Evaluation (Addendum)
Anesthesia Evaluation  Patient identified by MRN, date of birth, ID band Patient awake    Reviewed: Allergy & Precautions, NPO status , Patient's Chart, lab work & pertinent test results  History of Anesthesia Complications Negative for: history of anesthetic complications  Airway Mallampati: I  TM Distance: >3 FB Neck ROM: Full    Dental  (+) Dental Advisory Given   Pulmonary neg pulmonary ROS,  11/13/2020 SARS coronavirus NEG   breath sounds clear to auscultation       Cardiovascular negative cardio ROS   Rhythm:Regular Rate:Normal     Neuro/Psych negative neurological ROS     GI/Hepatic Neg liver ROS, S/p gastric bypass   Endo/Other  Morbid obesity  Renal/GU negative Renal ROS     Musculoskeletal   Abdominal (+) + obese,   Peds  Hematology negative hematology ROS (+)   Anesthesia Other Findings   Reproductive/Obstetrics                            Anesthesia Physical Anesthesia Plan  ASA: III  Anesthesia Plan: General   Post-op Pain Management:    Induction: Intravenous  PONV Risk Score and Plan: 3 and Ondansetron, Dexamethasone and Scopolamine patch - Pre-op  Airway Management Planned: LMA  Additional Equipment: None  Intra-op Plan:   Post-operative Plan:   Informed Consent: I have reviewed the patients History and Physical, chart, labs and discussed the procedure including the risks, benefits and alternatives for the proposed anesthesia with the patient or authorized representative who has indicated his/her understanding and acceptance.     Dental advisory given  Plan Discussed with: CRNA and Surgeon  Anesthesia Plan Comments: (Plan routine monitors, GA with adductor canal and popliteal blocks for post op analgesia)       Anesthesia Quick Evaluation
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
## 2020-11-14 MED ORDER — OXYCODONE HCL 5 MG PO TABS: 5.0000 mg | ORAL_TABLET | ORAL | 0 refills | Status: AC | PRN

## 2020-11-14 MED ORDER — RIVAROXABAN 10 MG PO TABS: 10.0000 mg | ORAL_TABLET | Freq: Every day | ORAL | 0 refills | Status: DC

## 2020-11-14 MED ORDER — OXYCODONE HCL 5 MG PO TABS: 5.0000 mg | ORAL_TABLET | ORAL | 0 refills | Status: DC | PRN

## 2020-11-14 MED ORDER — SENNA 8.6 MG PO TABS: 2.0000 | ORAL_TABLET | Freq: Two times a day (BID) | ORAL | 0 refills | Status: DC

## 2020-11-14 MED ORDER — DOCUSATE SODIUM 100 MG PO CAPS: 100.0000 mg | ORAL_CAPSULE | Freq: Two times a day (BID) | ORAL | 0 refills | Status: DC

## 2020-11-14 NOTE — [place] FL2 (Signed)
[place] MEDICAID FL2 LEVEL OF CARE SCREENING TOOL     IDENTIFICATION  Patient Name: Sabrina Johnson Birthdate: [birth year] Sex: female Admission Date (Current Location): 11/13/2020  [facility] and [place] Number:  Producer, television/film/video and Address:  [facility],  [address] [place] [address]      Provider Number: [id number]  Attending Physician Name and Address:  Toni Arthurs, MD  Relative Name and Phone Number:  Sebastian Ache [phone]    Current Level of Care: Hospital Recommended Level of Care: Skilled Nursing Facility Prior Approval Number:    Date Approved/Denied:   PASRR Number: [id number]  Discharge Plan: SNF    Current Diagnoses: Patient Active Problem List   Diagnosis Date Noted  . Lisfranc dislocation, right, initial encounter 11/13/2020  . Lisfranc's dislocation, right, initial encounter 11/13/2020  . S/P hysterectomy 09/28/2012    Orientation RESPIRATION BLADDER Height & Weight     Self,Time,Situation,Place  Normal Continent Weight: 210 lb (95.3 kg) Height:  5\' 5"  (165.1 cm)  BEHAVIORAL SYMPTOMS/MOOD NEUROLOGICAL BOWEL NUTRITION STATUS      Continent Diet (Regular Diet)  AMBULATORY STATUS COMMUNICATION OF NEEDS Skin   Extensive Assist Verbally Surgical wounds (Right Foot Injury)                       Personal Care Assistance Level of Assistance  Bathing,Feeding,Dressing Bathing Assistance: Maximum assistance Feeding assistance: Independent Dressing Assistance: Maximum assistance     Functional Limitations Info  Sight,Hearing,Speech Sight Info: Adequate Hearing Info: Adequate Speech Info: Adequate    SPECIAL CARE FACTORS FREQUENCY  OT (By licensed OT),PT (By licensed PT)     PT Frequency: 5x/week OT Frequency: 5x/week            Contractures Contractures Info: Not present    Additional Factors Info  Code Status,Allergies,Psychotropic Code Status Info: Fullcode Allergies Info: Allergies: No Known  Allergies           Current Medications (11/14/2020):  This is the current hospital active medication list Current Facility-Administered Medications  Medication Dose Route Frequency Provider Last Rate Last Admin  . 0.9 %  sodium chloride infusion   Intravenous Continuous 01/14/2021, Jacinta Shoe 75 mL/hr at 11/13/20 1657 New Bag at 11/13/20 1657  . acetaminophen (TYLENOL) tablet 325-650 mg  325-650 mg Oral Q6H PRN 01/13/21, PA-C      . diphenhydrAMINE (BENADRYL) 12.5 MG/5ML elixir 12.5-25 mg  12.5-25 mg Oral Q4H PRN 10-08-1984, PA-C      . docusate sodium (COLACE) capsule 100 mg  100 mg Oral BID Jacinta Shoe, PA-C   100 mg at 11/14/20 01/14/21  . enoxaparin (LOVENOX) injection 40 mg  40 mg Subcutaneous Q24H 0762, PA-C   40 mg at 11/14/20 0841  . HYDROmorphone (DILAUDID) injection 0.5-1 mg  0.5-1 mg Intravenous Q4H PRN 01/14/21, PA-C   1 mg at 11/14/20 0555  . methocarbamol (ROBAXIN) tablet 500 mg  500 mg Oral Q6H PRN 01/14/21, PA-C   500 mg at 11/14/20 01/14/21   Or  . methocarbamol (ROBAXIN) 500 mg in dextrose 5 % 50 mL IVPB  500 mg Intravenous Q6H PRN 2633, PA-C 100 mL/hr at 11/13/20 1443 500 mg at 11/13/20 1443  . ondansetron (ZOFRAN) tablet 4 mg  4 mg Oral Q6H PRN 01/13/21, PA-C       Or  . ondansetron [facility]) injection 4 mg  4 mg Intravenous Q6H  PRN Jacinta Shoe, PA-C      . oxyCODONE (Oxy IR/ROXICODONE) immediate release tablet 10-15 mg  10-15 mg Oral Q4H PRN Jacinta Shoe, PA-C   15 mg at 11/14/20 1025  . oxyCODONE (Oxy IR/ROXICODONE) immediate release tablet 5-10 mg  5-10 mg Oral Q4H PRN Jacinta Shoe, PA-C      . senna (SENOKOT) tablet 8.6 mg  1 tablet Oral BID Jacinta Shoe, PA-C   8.6 mg at 11/14/20 0840     Discharge Medications: Please see discharge summary for a list of discharge medications.  Relevant Imaging Results:  Relevant Lab Results:   Additional Information ssn:  [id number]  Clearance Coots, LCSW
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
## 2020-11-14 NOTE — TOC Initial Note (Signed)
Transition of Care [facility]) - Initial/Assessment Note    Patient Details  Name: Sabrina Johnson MRN: [id number] Date of Birth: 05-20-1961  Transition of Care [facility]) CM/SW Contact:    Lia Hopping, [place] Phone Number: 11/14/2020, 11:51 AM  Clinical Narrative:    Patient admitted for a open reduction internal fixation, Foot Lisfranc fracture (right).               CSW met with the patient at bedside to discuss rehab at [facility]. Patient agreeable. Patient reports she recently moved to [place] and lives on the 3rd flow of the apartment complex. Patient lives alone and will not have immediate support. Patient son lives out of state. Patient reports this is the first time she has been to rehab. CSW explain the SNF search process. Patient gave CSW permission to make referrals to the SNF's in the area and surrounding counties. CSW also explain [employer] required authorization process. Patient report understanding.  CSW will follow up with SNF bed offer's when available.  FL2 completed.   Patient vaccinated.    Expected Discharge Plan: Skilled Nursing Facility Barriers to Discharge: SNF Pending bed offer,Insurance Authorization   Patient Goals and CMS Choice Patient states their goals for this hospitalization and ongoing recovery are:: to go to a rehab facility       Expected Discharge Plan and Services Expected Discharge Plan: [place] In-house Referral: Clinical Social Work Discharge Planning Services: CM Consult Post Acute Care Choice: [place] Living arrangements for the past 2 months: Apartment                                      Prior Living Arrangements/Services Living arrangements for the past 2 months: Apartment Lives with:: Self Patient language and need for interpreter reviewed:: No Do you feel safe going back to the place where you live?: No   No support at home, lives on the 3rd floor  Need for Family Participation in  Patient Care: Yes (Comment) Care giver support system in place?: Yes (comment) Current home services: DME Criminal Activity/Legal Involvement Pertinent to Current Situation/Hospitalization: No - Comment as needed  Activities of Daily Living Home Assistive Devices/Equipment: Eyeglasses,Crutches (reading eyeglass) ADL Screening (condition at time of admission) Patient's cognitive ability adequate to safely complete daily activities?: Yes Is the patient deaf or have difficulty hearing?: No Does the patient have difficulty seeing, even when wearing glasses/contacts?: No Does the patient have difficulty concentrating, remembering, or making decisions?: No Patient able to express need for assistance with ADLs?: Yes Does the patient have difficulty dressing or bathing?: No Independently performs ADLs?: Yes (appropriate for developmental age) Does the patient have difficulty walking or climbing stairs?: No Weakness of Legs: None Weakness of Arms/Hands: None  Permission Sought/Granted Permission sought to share information with : Family Chief Financial Officer Permission granted to share information with : Yes, Verbal Permission Granted              Emotional Assessment Appearance:: Appears stated age Attitude/Demeanor/Rapport: Engaged Affect (typically observed): Accepting Orientation: : Oriented to Self,Oriented to Place,Oriented to  Time,Oriented to Situation Alcohol / Substance Use: Not Applicable Psych Involvement: No (comment)  Admission diagnosis:  Lisfranc dislocation, right, initial encounter [S93.324A] Lisfranc's dislocation, right, initial encounter [G90.211D] Patient Active Problem List   Diagnosis Date Noted  . Lisfranc dislocation, right, initial encounter 11/13/2020  . Lisfranc's dislocation, right, initial  encounter 11/13/2020  . S/P hysterectomy 09/28/2012   PCP:  Shirline Frees, MD Pharmacy:   [facility] [place], [place] [address] [place] [address] Phone: [phone] Fax: [phone]  [id number] [id number] [id number]: [phone]: [phone]    Social Determinants of Health (SDOH) Interventions    Readmission Risk Interventions No flowsheet data found.
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
## 2020-11-14 NOTE — Progress Notes (Signed)
Subjective: 1 Day Post-Op Procedure(s) (LRB): OPEN REDUCTION INTERNAL FIXATION (ORIF) FOOT LISFRANC FRACTURE (Right)  Patient reports pain as mild to moderate.  Denies fever, chills, N/V, CP, SOB.  Tolerating POs well.  Resting comfortably in bed.  Objective:   VITALS:  Temp:  [97.4 F (36.3 C)-98.4 F (36.9 C)] 98 F (36.7 C) (03/09 0527) Pulse Rate:  [62-75] 71 (03/09 0527) Resp:  [9-19] 16 (03/09 0527) BP: (101-150)/(47-88) 129/54 (03/09 0527) SpO2:  [99 %-100 %] 100 % (03/09 0527) Weight:  [95.3 kg] 95.3 kg (03/08 0940)  General: WDWN patient in NAD. Psych:  Appropriate mood and affect. Neuro:  A&O x 3, Moving all extremities, sensation intact to light touch HEENT:  EOMs intact Chest:  Even non-labored respirations Skin:  SLS C/D/I, no rashes or lesions Extremities: warm/dry, no visible edema, erythema or echymosis.  No lymphadenopathy. Pulses: Popliteus 2+ MSK:  ROM: EHL/FHL intact, MMT: able to perform quad set    LABS No results for input(s): HGB, WBC, PLT in the last 72 hours. No results for input(s): NA, K, CL, CO2, BUN, CREATININE, GLUCOSE in the last 72 hours. No results for input(s): LABPT, INR in the last 72 hours.   Assessment/Plan: 1 Day Post-Op Procedure(s) (LRB): OPEN REDUCTION INTERNAL FIXATION (ORIF) FOOT LISFRANC FRACTURE (Right)  NWB R LE Up with therapy DVT ppx:  Lovenox in house; transition to Xarelto upon D/C Disp:  SNF D/C scripts on chart Plan for 2 week outpatient post-op visit. After searching [place] PMP Aware the patient is provided a Rx for oxycodone.  Alfredo Martinez PA-C [facility] Office:  [phone]
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
## 2020-11-14 NOTE — Progress Notes (Addendum)
Rehab Admissions Coordinator Note:  Patient was screened by Clois Dupes for appropriateness for an Inpatient Acute Rehab Consult per therapy recommendations. I do not feel patient in need of intense therapy for this diagnosis, nor is there a medical need for hospital based rehab program with daily MD follow up. Other rehab venues are recommended. I will alert acute team and TOC.  Clois Dupes RN MSN 11/14/2020, 11:27 AM  I can be reached at [phone].

## 2020-11-15 NOTE — Progress Notes (Signed)
Subjective: 2 Days Post-Op Procedure(s) (LRB): OPEN REDUCTION INTERNAL FIXATION (ORIF) FOOT LISFRANC FRACTURE (Right)  Patient reports pain as mild to moderate.  Resting comfortably in the chair this morning.  Denies fever, chills, N/V, CP, SOB.  Tolerating POs well.  Notes that she worked well with therapy.  Reports that she is ready for D/C to SNF.  Objective:   VITALS:  Temp:  [99 F (37.2 C)-99.3 F (37.4 C)] 99.3 F (37.4 C) (03/10 0529) Pulse Rate:  [63-76] 67 (03/10 0529) Resp:  [16] 16 (03/10 0529) BP: (111-116)/(49-57) 111/49 (03/10 0529) SpO2:  [93 %-100 %] 99 % (03/10 0529)  General: WDWN patient in NAD. Psych:  Appropriate mood and affect. Neuro:  A&O x 3, Moving all extremities, sensation intact to light touch HEENT:  EOMs intact Chest:  Even non-labored respirations Skin:  SLS C/D/I, no rashes or lesions Extremities: warm/dry, no visible edema, erythema or echymosis.  No lymphadenopathy. Pulses: Popliteus 2+ MSK:  ROM: EHL/FHL intact, MMT: able to perform quad set    LABS No results for input(s): HGB, WBC, PLT in the last 72 hours. No results for input(s): NA, K, CL, CO2, BUN, CREATININE, GLUCOSE in the last 72 hours. No results for input(s): LABPT, INR in the last 72 hours.   Assessment/Plan: 2 Days Post-Op Procedure(s) (LRB): OPEN REDUCTION INTERNAL FIXATION (ORIF) FOOT LISFRANC FRACTURE (Right)  NWB R LE Up with therapy DVT ppx:  Lovenox in house;  Transition to Xarelto upon D/C Disp: SNF D/C scripts on chart Plan for 2 week outpatient post-op visit. Let me know when patient is ready for D/C and I will place order  Lolly Mustache Office:  [phone]

## 2020-11-15 NOTE — Discharge Summary (Signed)
Physician Discharge Summary  Patient ID: MARGURETE GUAMAN MRN: [id number] DOB/AGE: 59-Mar-1962 60 y.o.  Admit date: 11/13/2020 Discharge date: 11/15/2020  Admission Diagnoses: R lisfranc fx/dislocation; hx of hysterectomy  Discharge Diagnoses:  Active Problems:   Lisfranc dislocation, right, initial encounter   Lisfranc's dislocation, right, initial encounter same as above  Discharged Condition: stable  Hospital Course: Patient presented to [facility] OR on 11/13/20 for elective ORIF R lisfranc fx/dislocation by Dr. Toni Arthurs.  The patient tolerated the procedure well without complication.  She was then admitted to the hospital.  She worked well with therapy.  She her stay in the hospital was uncomplicated.  She is to be D/C'd to SNF on 11/15/20.  Consults:  PT/OT/case management  Significant Diagnostic Studies: radiology: X-Ray: to ensure satisfactory anatomic aligmnment during operative procedure.  Treatments: IV hydration, antibiotics: Ancef, analgesia: acetaminophen, acetaminophen w/ codeine, Dilaudid and oxycodone, anticoagulation: lovenox and surgery: as stated above.  Discharge Exam: Blood pressure (!) 111/49, pulse 67, temperature 99.3 F (37.4 C), temperature source Oral, resp. rate 16, height 5\' 5"  (1.651 m), weight 95.3 kg, last menstrual period 09/15/2012, SpO2 99 %. General: WDWN patient in NAD. Psych:  Appropriate mood and affect. Neuro:  A&O x 3, Moving all extremities, sensation intact to light touch HEENT:  EOMs intact Chest:  Even non-labored respirations Skin:  SLS C/D/I, no rashes or lesions Extremities: warm/dry, no visible edema, erythema or echymosis.  No lymphadenopathy. Pulses: Popliteus 2+ MSK:  ROM: EHL/FHL intact, MMT: able to perform quad set   Disposition: Discharge disposition: 03-Skilled Nursing Facility      Discharge Instructions     Call MD / Call 911   Complete by: As directed    If you experience chest pain or shortness of breath, CALL  911 and be transported to the hospital emergency room.  If you develope a fever above 101 F, pus (white drainage) or increased drainage or redness at the wound, or calf pain, call your surgeon's office.   Constipation Prevention   Complete by: As directed    Drink plenty of fluids.  Prune juice may be helpful.  You may use a stool softener, such as Colace (over the counter) 100 mg twice a day.  Use MiraLax (over the counter) for constipation as needed.   Diet - low sodium heart healthy   Complete by: As directed    Increase activity slowly as tolerated   Complete by: As directed    Non weight bearing   Complete by: As directed    Laterality: right   Extremity: Lower      Allergies as of 11/15/2020   No Known Allergies      Medication List     STOP taking these medications    oxyCODONE-acetaminophen 5-325 MG tablet Commonly known as: PERCOCET/ROXICET       TAKE these medications    docusate sodium 100 MG capsule Commonly known as: Colace Take 1 capsule (100 mg total) by mouth 2 (two) times daily. While taking narcotic pain medicine.   estradiol 0.05 mg/24hr patch Commonly known as: CLIMARA - Dosed in mg/24 hr Place 1 patch (0.05 mg total) onto the skin once a week.   methocarbamol 500 MG tablet Commonly known as: ROBAXIN Take 1 tablet (500 mg total) by mouth 2 (two) times daily.   oxyCODONE 5 MG immediate release tablet Commonly known as: Roxicodone Take 1 tablet (5 mg total) by mouth every 4 (four) hours as needed for up to 5 days  for moderate pain or severe pain.   rivaroxaban 10 MG Tabs tablet Commonly known as: Xarelto Take 1 tablet (10 mg total) by mouth daily.   senna 8.6 MG Tabs tablet Commonly known as: SENOKOT Take 2 tablets (17.2 mg total) by mouth 2 (two) times daily.               Discharge Care Instructions  (From admission, onward)           Start     Ordered   11/15/20 0000  Non weight bearing       Question Answer Comment   Laterality right   Extremity Lower      11/15/20 1334            Contact information for follow-up providers     Toni Arthurs, MD. Schedule an appointment as soon as possible for a visit in 2 weeks.   Specialty: Orthopedic Surgery Contact information: [address] [place] [address] [place] [address] [phone]              Contact information for after-discharge care     Destination     [facility] Preferred SNF .   Service: Skilled Nursing Contact information: [address] [place] [address] [phone]                     Signed: Lolly Mustache Office:  [phone]

## 2020-11-15 NOTE — TOC Transition Note (Signed)
Transition of Care [facility]) - CM/SW Discharge Note   Patient Details  Name: Sabrina Johnson MRN: [id number] Date of Birth: 1961/03/23  Transition of Care [facility]) CM/SW Contact:  Clearance Coots, LCSW Phone Number: 11/15/2020, 1:45 PM   Clinical Narrative:    Facility staff [employer] notified CSW  [facility] auth. Approval received. [facility] ready to accept the patient today.  Nurse call report to: [phone] PTAR arranged to transport.  Facility staff to assist/transprot patient personal belonging, ptar will not transport.    Final next level of care: Skilled Nursing Facility Barriers to Discharge: Barriers Resolved   Patient Goals and CMS Choice Patient states their goals for this hospitalization and ongoing recovery are:: to go to a rehab facility      Discharge Placement PASRR number recieved: 11/14/20            Patient chooses bed at: [facility] Patient to be transferred to facility by: PTAR Name of family member notified: Patient to notify her son Patient and family notified of of transfer: 11/15/20  Discharge Plan and Services In-house Referral: Clinical Social Work Discharge Planning Services: CM Consult Post Acute Care Choice: IP Rehab,Skilled Nursing Facility                               Social Determinants of Health (SDOH) Interventions     Readmission Risk Interventions No flowsheet data found.
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
## 2020-11-27 ENCOUNTER — Encounter (HOSPITAL_COMMUNITY): Payer: Self-pay | Admitting: Orthopedic Surgery
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
## 2021-11-09 DIAGNOSIS — J0100 Acute maxillary sinusitis, unspecified: Secondary | ICD-10-CM | POA: Diagnosis not present

## 2021-11-09 DIAGNOSIS — J209 Acute bronchitis, unspecified: Secondary | ICD-10-CM | POA: Diagnosis not present

## 2021-11-09 DIAGNOSIS — Z03818 Encounter for observation for suspected exposure to other biological agents ruled out: Secondary | ICD-10-CM | POA: Diagnosis not present

## 2021-11-09 DIAGNOSIS — R059 Cough, unspecified: Secondary | ICD-10-CM | POA: Diagnosis not present

## 2021-11-09 DIAGNOSIS — Z20822 Contact with and (suspected) exposure to covid-19: Secondary | ICD-10-CM | POA: Diagnosis not present

## 2022-01-31 DIAGNOSIS — E538 Deficiency of other specified B group vitamins: Secondary | ICD-10-CM | POA: Diagnosis not present

## 2022-01-31 DIAGNOSIS — Z6835 Body mass index (BMI) 35.0-35.9, adult: Secondary | ICD-10-CM | POA: Diagnosis not present

## 2022-01-31 DIAGNOSIS — D508 Other iron deficiency anemias: Secondary | ICD-10-CM | POA: Diagnosis not present

## 2022-01-31 DIAGNOSIS — Z9884 Bariatric surgery status: Secondary | ICD-10-CM | POA: Diagnosis not present

## 2022-01-31 DIAGNOSIS — Z13220 Encounter for screening for lipoid disorders: Secondary | ICD-10-CM | POA: Diagnosis not present

## 2022-01-31 DIAGNOSIS — Z0000 Encounter for general adult medical examination without abnormal findings: Secondary | ICD-10-CM | POA: Diagnosis not present

## 2022-01-31 DIAGNOSIS — E559 Vitamin D deficiency, unspecified: Secondary | ICD-10-CM | POA: Diagnosis not present

## 2022-01-31 DIAGNOSIS — Z7184 Encounter for health counseling related to travel: Secondary | ICD-10-CM | POA: Diagnosis not present

## 2022-03-06 DIAGNOSIS — D518 Other vitamin B12 deficiency anemias: Secondary | ICD-10-CM | POA: Diagnosis not present

## 2022-04-21 DIAGNOSIS — D518 Other vitamin B12 deficiency anemias: Secondary | ICD-10-CM | POA: Diagnosis not present

## 2022-05-25 DIAGNOSIS — D513 Other dietary vitamin B12 deficiency anemia: Secondary | ICD-10-CM | POA: Diagnosis not present

## 2022-05-25 DIAGNOSIS — D518 Other vitamin B12 deficiency anemias: Secondary | ICD-10-CM | POA: Diagnosis not present

## 2022-06-24 DIAGNOSIS — D511 Vitamin B12 deficiency anemia due to selective vitamin B12 malabsorption with proteinuria: Secondary | ICD-10-CM | POA: Diagnosis not present

## 2022-06-24 DIAGNOSIS — D518 Other vitamin B12 deficiency anemias: Secondary | ICD-10-CM | POA: Diagnosis not present

## 2022-07-28 DIAGNOSIS — D518 Other vitamin B12 deficiency anemias: Secondary | ICD-10-CM | POA: Diagnosis not present

## 2022-08-09 DIAGNOSIS — J069 Acute upper respiratory infection, unspecified: Secondary | ICD-10-CM | POA: Diagnosis not present

## 2022-08-09 DIAGNOSIS — Z6832 Body mass index (BMI) 32.0-32.9, adult: Secondary | ICD-10-CM | POA: Diagnosis not present

## 2022-08-09 DIAGNOSIS — Z789 Other specified health status: Secondary | ICD-10-CM | POA: Diagnosis not present

## 2022-08-09 DIAGNOSIS — Z03818 Encounter for observation for suspected exposure to other biological agents ruled out: Secondary | ICD-10-CM | POA: Diagnosis not present

## 2023-09-29 ENCOUNTER — Ambulatory Visit (INDEPENDENT_AMBULATORY_CARE_PROVIDER_SITE_OTHER): Payer: No Typology Code available for payment source | Admitting: Internal Medicine

## 2023-09-29 ENCOUNTER — Other Ambulatory Visit (HOSPITAL_BASED_OUTPATIENT_CLINIC_OR_DEPARTMENT_OTHER): Payer: Self-pay

## 2023-09-29 ENCOUNTER — Telehealth: Payer: Self-pay | Admitting: Pharmacist

## 2023-09-29 ENCOUNTER — Encounter: Payer: Self-pay | Admitting: Internal Medicine

## 2023-09-29 VITALS — BP 112/70 | HR 62 | Temp 97.6°F | Ht 65.0 in | Wt 216.8 lb

## 2023-09-29 DIAGNOSIS — E785 Hyperlipidemia, unspecified: Secondary | ICD-10-CM | POA: Diagnosis not present

## 2023-09-29 DIAGNOSIS — D519 Vitamin B12 deficiency anemia, unspecified: Secondary | ICD-10-CM | POA: Insufficient documentation

## 2023-09-29 DIAGNOSIS — E669 Obesity, unspecified: Secondary | ICD-10-CM | POA: Diagnosis not present

## 2023-09-29 DIAGNOSIS — E559 Vitamin D deficiency, unspecified: Secondary | ICD-10-CM | POA: Diagnosis not present

## 2023-09-29 LAB — LIPID PANEL
Cholesterol: 307 mg/dL — ABNORMAL HIGH (ref 0–200)
HDL: 81.4 mg/dL (ref >39.00)
LDL Cholesterol: 215 mg/dL — ABNORMAL HIGH (ref 0–99)
NonHDL: 225.46
Total CHOL/HDL Ratio: 4
Triglycerides: 50 mg/dL (ref 0.0–149.0)
VLDL: 10 mg/dL (ref 0.0–40.0)

## 2023-09-29 LAB — COMPREHENSIVE METABOLIC PANEL
ALT: 15 U/L (ref 0–35)
AST: 20 U/L (ref 0–37)
Albumin: 4.2 g/dL (ref 3.5–5.2)
Alkaline Phosphatase: 61 U/L (ref 39–117)
BUN: 18 mg/dL (ref 6–23)
CO2: 30 meq/L (ref 19–32)
Calcium: 9.4 mg/dL (ref 8.4–10.5)
Chloride: 104 meq/L (ref 96–112)
Creatinine, Ser: 0.65 mg/dL (ref 0.40–1.20)
GFR: 94.67 mL/min (ref >60.00)
Glucose, Bld: 95 mg/dL (ref 70–99)
Potassium: 4.1 meq/L (ref 3.5–5.1)
Sodium: 142 meq/L (ref 135–145)
Total Bilirubin: 0.4 mg/dL (ref 0.2–1.2)
Total Protein: 7.4 g/dL (ref 6.0–8.3)

## 2023-09-29 LAB — CBC WITH DIFFERENTIAL/PLATELET
Basophils Absolute: 0 10*3/uL (ref 0.0–0.1)
Basophils Relative: 0.7 % (ref 0.0–3.0)
Eosinophils Absolute: 0.1 10*3/uL (ref 0.0–0.7)
Eosinophils Relative: 2 % (ref 0.0–5.0)
HCT: 40.8 % (ref 36.0–46.0)
Hemoglobin: 13.3 g/dL (ref 12.0–15.0)
Lymphocytes Relative: 43.2 % (ref 12.0–46.0)
Lymphs Abs: 1.3 10*3/uL (ref 0.7–4.0)
MCHC: 32.5 g/dL (ref 30.0–36.0)
MCV: 94.5 fL (ref 78.0–100.0)
Monocytes Absolute: 0.3 10*3/uL (ref 0.1–1.0)
Monocytes Relative: 11.2 % (ref 3.0–12.0)
Neutro Abs: 1.3 10*3/uL — ABNORMAL LOW (ref 1.4–7.7)
Neutrophils Relative %: 42.9 % — ABNORMAL LOW (ref 43.0–77.0)
Platelets: 236 10*3/uL (ref 150.0–400.0)
RBC: 4.31 Mil/uL (ref 3.87–5.11)
RDW: 14.7 % (ref 11.5–15.5)
WBC: 2.9 10*3/uL — ABNORMAL LOW (ref 4.0–10.5)

## 2023-09-29 LAB — VITAMIN B12: Vitamin B-12: >1537 pg/mL — ABNORMAL HIGH (ref 211–911)

## 2023-09-29 LAB — HEMOGLOBIN A1C: Hgb A1c MFr Bld: 6 % (ref 4.6–6.5)

## 2023-09-29 LAB — VITAMIN D 25 HYDROXY (VIT D DEFICIENCY, FRACTURES): VITD: 15.96 ng/mL — ABNORMAL LOW (ref 30.00–100.00)

## 2023-09-29 MED ORDER — CYANOCOBALAMIN 1000 MCG/ML IJ SOLN
1000.0000 ug | Freq: Once | INTRAMUSCULAR | Status: AC
  Administered 2023-09-29: 1000 ug via INTRAMUSCULAR

## 2023-09-29 MED ORDER — ZEPBOUND 2.5 MG/0.5ML ~~LOC~~ SOAJ
2.5000 mg | SUBCUTANEOUS | 0 refills | Status: DC
Start: 2023-09-29 — End: 2023-10-14
  Filled 2023-09-29 – 2023-09-30 (×2): qty 2, 28d supply, fill #0

## 2023-09-29 NOTE — Telephone Encounter (Signed)
Pharmacy Patient Advocate Encounter   Received notification from Physician's Office that prior authorization for Zepbound 2.5MG /0.5ML pen-injectors is required/requested.   Insurance verification completed.   The patient is insured through [facility] [employer] .   Per test claim: PA required; PA submitted to above mentioned insurance via CoverMyMeds Key/confirmation #/EOC [id number] Status is pending
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
## 2023-09-30 ENCOUNTER — Other Ambulatory Visit (HOSPITAL_COMMUNITY): Payer: Self-pay

## 2023-09-30 ENCOUNTER — Other Ambulatory Visit (HOSPITAL_BASED_OUTPATIENT_CLINIC_OR_DEPARTMENT_OTHER): Payer: Self-pay

## 2023-09-30 NOTE — Telephone Encounter (Signed)
Pharmacy Patient Advocate Encounter  Received notification from  [employer] [employer]  that Prior Authorization for Zepbound 2.5MG /0.5ML pen-injectors has been DENIED.  See denial reason below. No denial letter attached in CMM. Will attach denial letter to Media tab once received.   PA #/Case ID/Reference #: [id number]

## 2023-10-01 ENCOUNTER — Encounter: Payer: Self-pay | Admitting: Internal Medicine

## 2023-10-01 ENCOUNTER — Other Ambulatory Visit: Payer: Self-pay | Admitting: Internal Medicine

## 2023-10-01 DIAGNOSIS — E559 Vitamin D deficiency, unspecified: Secondary | ICD-10-CM

## 2023-10-01 DIAGNOSIS — E785 Hyperlipidemia, unspecified: Secondary | ICD-10-CM

## 2023-10-01 DIAGNOSIS — R7303 Prediabetes: Secondary | ICD-10-CM | POA: Insufficient documentation

## 2023-10-01 MED ORDER — VITAMIN D (ERGOCALCIFEROL) 1.25 MG (50000 UNIT) PO CAPS: 50000.0000 [IU] | ORAL_CAPSULE | ORAL | 0 refills | Status: DC

## 2023-10-01 MED ORDER — ROSUVASTATIN CALCIUM 20 MG PO TABS: 20.0000 mg | ORAL_TABLET | Freq: Every day | ORAL | 1 refills | Status: AC

## 2023-10-01 NOTE — Telephone Encounter (Signed)
Provider aware

## 2023-10-06 ENCOUNTER — Ambulatory Visit: Payer: 59 | Admitting: Family Medicine

## 2023-10-06 DIAGNOSIS — E669 Obesity, unspecified: Secondary | ICD-10-CM

## 2023-10-06 DIAGNOSIS — R7303 Prediabetes: Secondary | ICD-10-CM

## 2023-10-07 ENCOUNTER — Telehealth: Payer: Self-pay

## 2023-10-07 MED ORDER — VITAMIN D (ERGOCALCIFEROL) 1.25 MG (50000 UNIT) PO CAPS
50000.0000 [IU] | ORAL_CAPSULE | ORAL | 0 refills | Status: DC
Start: 2023-10-07 — End: 2024-01-08

## 2023-10-07 NOTE — Addendum Note (Signed)
Addended by: Mary Sella D on: 10/07/2023 11:16 AM   Modules accepted: Orders

## 2023-10-07 NOTE — Telephone Encounter (Addendum)
Patient returned call to discuss labs and would like pharmacy changed to [facility],  she agrees to taking vitamin D and declined crestor she would like to work on lifestyle changes and diet.  Patient asked for referral to [facility] (referral was placed ok'd by Salvatore Decent, FNP) Patient has follow- up appt scheduled and would like lab work done on same day.

## 2023-10-07 NOTE — Telephone Encounter (Signed)
Copied from CRM [id number]. Topic: Clinical - Medical Advice >> Oct 07, 2023 11:46 AM Leavy Cella D wrote: Reason for CRM: Patient spoke with Netta Corrigan and was advised to call [facility]. Patient called back to inform Nurse Netta Corrigan on conversation . Please call patient back when available

## 2023-10-14 ENCOUNTER — Other Ambulatory Visit (HOSPITAL_BASED_OUTPATIENT_CLINIC_OR_DEPARTMENT_OTHER): Payer: Self-pay

## 2023-10-14 ENCOUNTER — Other Ambulatory Visit: Payer: Self-pay | Admitting: Internal Medicine

## 2023-10-14 ENCOUNTER — Telehealth: Payer: Self-pay | Admitting: Internal Medicine

## 2023-10-14 DIAGNOSIS — E669 Obesity, unspecified: Secondary | ICD-10-CM

## 2023-10-14 MED ORDER — TIRZEPATIDE-WEIGHT MANAGEMENT 5 MG/0.5ML ~~LOC~~ SOAJ: 5.0000 mg | SUBCUTANEOUS | 2 refills | Status: DC

## 2023-10-14 NOTE — Telephone Encounter (Signed)
 Pt walked in requesting to speak with the CMA/ wanting her to send a medication refill for her Rx #: [id number]  tirzepatide  (ZEPBOUND ) 2.5 MG/0.5ML Pen [[id number]] to zepbound  office directly as she's got just 1 pen left, so she can get the zepbound  vile. Asap. She wants a call back at [phone].

## 2023-10-14 NOTE — Telephone Encounter (Signed)
 Rx sent in. Dose has been increased to 5mg . She will need to complete the 2.5mg  dose next week when due, then can start the new prescription of 5mg 

## 2023-10-15 MED ORDER — TIRZEPATIDE-WEIGHT MANAGEMENT 5 MG/0.5ML ~~LOC~~ SOLN
5.0000 mg | SUBCUTANEOUS | 2 refills | Status: DC
Start: 2023-10-15 — End: 2023-12-04

## 2023-10-15 NOTE — Telephone Encounter (Signed)
 Spoke to patient and informed her that RX was sent to pharmacy; patient stated she was receiving medication through [facility] medication website for a cheaper cost. I did advise patient; that I will touch base with Rosina Senters NP and supervisor Dedra RN to address and help get medication to her. Patient verbalized understanding and requesting call back once complete.

## 2023-10-15 NOTE — Telephone Encounter (Signed)
 Spoke with our [employer], [employer], regarding the process of [facility]. 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  Steps for Provider: Send the script to Mail Order, [facility], select cash pay or self pay (DO NOT select the box for [facility], select tirzepatide  weight loss or Zepbound  Sub[employer].  [facility] will send the pt 4 single-dose vials, a 20-month suppy at a time. 4 vials of 2.5mg  is $399 cash pay and 4 vials of 5mg  is $549 cash pay. Once the script is sent in, the patient will receive a text from [facility] to pay and arrange shipping. Pt receives a reminder text via their preferred communication that is set up (text or email).

## 2023-10-16 ENCOUNTER — Other Ambulatory Visit (HOSPITAL_BASED_OUTPATIENT_CLINIC_OR_DEPARTMENT_OTHER): Payer: Self-pay

## 2023-10-16 NOTE — Telephone Encounter (Signed)
 Copied from CRM [id number]. Topic: Clinical - Prescription Issue >> Oct 15, 2023  2:11 PM Sabrina Johnson wrote: Reason for CRM: Patient called regarding Zepbound , says she was suppose to receive a call back yesterday. Lavon not in the office today but she would like a phone call back today regarding the Zepbound 

## 2023-10-16 NOTE — Telephone Encounter (Signed)
 Spoke with pt yesterday, late afternoon.

## 2023-10-19 ENCOUNTER — Telehealth: Payer: Self-pay | Admitting: Internal Medicine

## 2023-10-19 ENCOUNTER — Other Ambulatory Visit (HOSPITAL_COMMUNITY): Payer: Self-pay

## 2023-10-19 NOTE — Telephone Encounter (Signed)
 Copied from CRM [id number]. Topic: Clinical - Medication Question >> Oct 19, 2023  9:28 AM Sabrina Johnson wrote: Reason for CRM: patient rx ZEPBOUND   has been denied AND SHE NEEDS A PRIOR AUTHORIZATION  PATIENT WANTS TO HEAR BACK DIRECTLY FROM THE [place] CALL PT [phone]

## 2023-10-20 ENCOUNTER — Other Ambulatory Visit (HOSPITAL_COMMUNITY): Payer: Self-pay

## 2023-10-20 NOTE — Telephone Encounter (Signed)
Called pt to reiterate that her insurance company may not cover weight-loss medication, which is why the PA was denied. Advised pt to call her insurance company, if they do not cover, an appeal is likely to be denied as well.

## 2023-10-20 NOTE — Telephone Encounter (Signed)
I discussed this with pt on the phone on 10/15/23. Pt aware of OOP costs, which is why she asked for script directly through [facility]. Please see Refill Encounter on 10/14/23 (may have to uncheck the box for "uncheck to show all" to view).

## 2023-10-20 NOTE — Telephone Encounter (Signed)
Please notify the patient that it will be out of pocket since her insurance denied it.

## 2023-10-29 ENCOUNTER — Ambulatory Visit: Payer: No Typology Code available for payment source | Admitting: Internal Medicine

## 2023-11-05 ENCOUNTER — Encounter: Payer: Self-pay | Admitting: Internal Medicine

## 2023-11-05 ENCOUNTER — Ambulatory Visit (INDEPENDENT_AMBULATORY_CARE_PROVIDER_SITE_OTHER): Payer: No Typology Code available for payment source | Admitting: Internal Medicine

## 2023-11-05 VITALS — BP 120/70 | HR 70 | Temp 97.8°F | Ht 65.0 in | Wt 206.2 lb

## 2023-11-05 DIAGNOSIS — E669 Obesity, unspecified: Secondary | ICD-10-CM

## 2023-11-05 DIAGNOSIS — E559 Vitamin D deficiency, unspecified: Secondary | ICD-10-CM | POA: Diagnosis not present

## 2023-11-05 DIAGNOSIS — D519 Vitamin B12 deficiency anemia, unspecified: Secondary | ICD-10-CM | POA: Diagnosis not present

## 2023-11-05 DIAGNOSIS — E785 Hyperlipidemia, unspecified: Secondary | ICD-10-CM

## 2023-11-05 MED ORDER — CYANOCOBALAMIN 1000 MCG/ML IJ SOLN
1000.0000 ug | Freq: Once | INTRAMUSCULAR | Status: AC
  Administered 2023-11-05: 1000 ug via INTRAMUSCULAR

## 2023-11-05 NOTE — Progress Notes (Signed)
 [facility] [address] [place] [address] Dept: [phone] Dept Fax: [phone]    Subjective:   Sabrina Johnson 04/29/1961 11/05/2023  Chief Complaint  Patient presents with   Follow-up    Discuss iron, b-12, vitamin d   Medication Refill    HPI: Sabrina Johnson presents today for re-assessment and management of chronic medical conditions.  Discussed the use of AI scribe software for clinical note transcription with the patient, who gave verbal consent to proceed.  History of Present Illness   The patient, with a history of obesity, Vitamin D defiency, HLD, and vitamin B12 deficiency, presents for a follow-up visit to discuss her weight loss progress. She reports a weight loss of approximately 11 pounds since her last visit, which she attributes to the use of Zepbound, a weight loss medication, and lifestyle modifications including dietary changes and mild exercise. Currently on Zepbound 5mg  weekly. She reports feeling lighter and notes that her rings are fitting more loosely. She denies any significant side effects from the Zepbound, such as nausea, diarrhea, or constipation. She is adhering to a healthy diet and avoiding fast food, greasy/fatty foods, and sweets. She is staying active but does not have a current exercise regimen.   The patient also discusses her vitamin B12 deficiency and the monthly B12 injections she receives. She reports no issues with these injections. She also mentions a concern about her cholesterol levels, which were previously found to be high, but she has not started the prescribed medication (Crestor/rosuvastatin) yet. She expresses a desire to recheck her cholesterol levels after further weight loss.  Lastly, the patient discusses her vitamin D supplementation. She is currently taking a once-weekly supplement and expresses curiosity about the absorption of this supplement.    She also  mentions a desire for iron supplementation due to frequently cold hands, but she acknowledges that she has not been diagnosed with anemia.         Wt Readings from Last 3 Encounters:  11/05/23 206 lb 3.2 oz (93.5 kg)  09/29/23 216 lb 12.8 oz (98.3 kg)  11/13/20 210 lb (95.3 kg)     The following portions of the patient's history were reviewed and updated as appropriate: past medical history, past surgical history, family history, social history, allergies, medications, and problem list.   Patient Active Problem List   Diagnosis Date Noted   Prediabetes 10/01/2023   Anemia due to vitamin B12 deficiency 09/29/2023   Vitamin D deficiency 09/29/2023   Hyperlipidemia 09/29/2023   Obesity (BMI 30-39.9) 09/29/2023   Past Medical History:  Diagnosis Date   Anemia    History of blood transfusion 09/08/2010   [place]   Lisfranc dislocation, right, initial encounter 11/13/2020   Past Surgical History:  Procedure Laterality Date   abdominal      ABDOMINOPLASTY  2006   BILATERAL SALPINGECTOMY  09/27/2012   Procedure: BILATERAL SALPINGECTOMY;  Surgeon: Genia Del, MD;  Location: [facility];  Service: Gynecology;  Laterality: Bilateral;   GASTRIC BYPASS  2004   OPEN REDUCTION INTERNAL FIXATION (ORIF) FOOT LISFRANC FRACTURE Right 11/13/2020   Procedure: OPEN REDUCTION INTERNAL FIXATION (ORIF) FOOT LISFRANC FRACTURE;  Surgeon: Toni Arthurs, MD;  Location: [facility];  Service: Orthopedics;  Laterality: Right;    ROBOTIC ASSISTED TOTAL HYSTERECTOMY  09/27/2012   Procedure: ROBOTIC ASSISTED TOTAL HYSTERECTOMY;  Surgeon: Genia Del, MD;  Location: [facility];  Service: Gynecology;  Laterality: N/A;   History reviewed. No pertinent  family history.  Current Outpatient Medications:    tirzepatide 5 MG/0.5ML injection vial, Inject 5 mg into the skin once a week., Disp: 2 mL, Rfl: 2   Vitamin D, Ergocalciferol, (DRISDOL) 1.25 MG (50000 UNIT) CAPS capsule, Take 1 capsule (50,000 Units  total) by mouth every 7 (seven) days., Disp: 12 capsule, Rfl: 0   rosuvastatin (CRESTOR) 20 MG tablet, Take 1 tablet (20 mg total) by mouth daily. (Patient not taking: Reported on 11/05/2023), Disp: 90 tablet, Rfl: 1 No Known Allergies   ROS: A complete ROS was performed with pertinent positives/negatives noted in the HPI. The remainder of the ROS are negative.    Objective:   Today's Vitals   11/05/23 1308  BP: 120/70  Pulse: 70  Temp: 97.8 F (36.6 C)  TempSrc: Temporal  SpO2: 99%  Weight: 206 lb 3.2 oz (93.5 kg)  Height: 5\' 5"  (1.651 m)    GENERAL: Well-appearing, in NAD. Well nourished.  SKIN: Pink, warm and dry.  RESPIRATORY: Chest wall symmetrical. Respirations even and non-labored. Breath sounds clear to auscultation bilaterally.  CARDIAC: S1, S2 present, regular rate and rhythm. Peripheral pulses 2+ bilaterally.  EXTREMITIES: Without clubbing, cyanosis, or edema.  NEUROLOGIC: Steady, even gait.  PSYCH/MENTAL STATUS: Alert, oriented x 3. Cooperative, appropriate mood and affect.   Health Maintenance Due  Topic Date Due   HIV Screening  Never done   Hepatitis C Screening  Never done   DTaP/Tdap/Td (1 - Tdap) Never done   Colonoscopy  Never done   Zoster Vaccines- Shingrix (1 of 2) Never done   MAMMOGRAM  10/24/2022    No results found for any visits on 11/05/23.  The 10-year ASCVD risk score (Arnett DK, et al., 2019) is: 6.5%     Assessment & Plan:  Assessment and Plan    Obesity Noted weight loss of 11 pounds since starting Zepbound. Patient reports adherence to dietary changes and plans to increase physical activity as weight loss progresses. -Continue Zepbound 5mg  weekly. - Advised healthy diet and exercise  Vitamin D Deficiency Patient is currently on weekly Vitamin D supplement. -Continue weekly Vitamin D supplement. -Recheck Vitamin D level in 2 months.  Hyperlipidemia Patient declined to start rosuvastatin. Discussed potential improvement with  weight loss. -Defer cholesterol medication. -Recheck cholesterol level in 2 months.  B12 Deficiency Patient is receiving monthly B12 injections. -Continue monthly B12 injections.  General Health Maintenance - advised patient she does not need iron supplement at this time.  -Continue monthly B12 injections. -Follow up in 2 months for reevaluation of weight, Vitamin D level, and cholesterol level.       No orders of the defined types were placed in this encounter.  No images are attached to the encounter or orders placed in the encounter. Meds ordered this encounter  Medications   cyanocobalamin (VITAMIN B12) injection 1,000 mcg    Return in about 3 months (around 02/02/2024) for Chronic Condition follow up - fasting lab work at appointment.   Salvatore Decent, FNP

## 2023-11-06 ENCOUNTER — Ambulatory Visit: Payer: No Typology Code available for payment source

## 2023-11-10 ENCOUNTER — Ambulatory Visit: Payer: No Typology Code available for payment source | Admitting: Internal Medicine

## 2023-11-30 ENCOUNTER — Encounter (INDEPENDENT_AMBULATORY_CARE_PROVIDER_SITE_OTHER): Payer: Self-pay

## 2023-12-04 ENCOUNTER — Telehealth: Payer: Self-pay | Admitting: Internal Medicine

## 2023-12-04 ENCOUNTER — Other Ambulatory Visit: Payer: Self-pay | Admitting: Internal Medicine

## 2023-12-04 ENCOUNTER — Ambulatory Visit

## 2023-12-04 DIAGNOSIS — E669 Obesity, unspecified: Secondary | ICD-10-CM

## 2023-12-04 DIAGNOSIS — D519 Vitamin B12 deficiency anemia, unspecified: Secondary | ICD-10-CM

## 2023-12-04 MED ORDER — TIRZEPATIDE-WEIGHT MANAGEMENT 7.5 MG/0.5ML ~~LOC~~ SOLN: 7.5000 mg | SUBCUTANEOUS | 2 refills | Status: DC

## 2023-12-04 MED ORDER — CYANOCOBALAMIN 1000 MCG/ML IJ SOLN
1000.0000 ug | Freq: Once | INTRAMUSCULAR | Status: AC
  Administered 2023-12-04: 1000 ug via INTRAMUSCULAR

## 2023-12-04 NOTE — Telephone Encounter (Signed)
 Rx sent

## 2023-12-04 NOTE — Telephone Encounter (Signed)
 Prescription Request  12/04/2023  LOV: 11/05/2023  What is the name of the medication or equipment? tirzepatide  injection vial, she now wants the 7mg   Have you contacted your pharmacy to request a refill? Yes   Which pharmacy would you like this sent to?  [facility] [place], [place] - [phone] [place] [address] Phone: [phone] Fax: [phone]    Patient notified that their request is being sent to the clinical staff for review and that they should receive a response within 2 business days.   Please advise at [facility] [phone]

## 2023-12-04 NOTE — Progress Notes (Signed)
 Per orders of Dr Veto Kemps, injection of B12 given in the LT deltiod by [facility], cma.  Patient tolerated injection well.  Dm/cma   LOT# [id number] Exp  01/2025 NDC# [id number]

## 2023-12-04 NOTE — Telephone Encounter (Signed)
 Last Ov 11/05/23 Filled 10/15/23

## 2024-01-07 NOTE — Progress Notes (Signed)
 [facility] [address] [place] [address] Dept: [phone] Dept Fax: [phone]    Subjective:   Sabrina Johnson 1961-04-19 01/08/2024  Chief Complaint  Patient presents with   Follow-up    Fasting  b-12 injection    HPI: Sabrina Johnson presents today for re-assessment and management of chronic medical conditions.  Discussed the use of AI scribe software for clinical note transcription with the patient, who gave verbal consent to proceed.  History of Present Illness   Sabrina Johnson is a 63 year old female with diabetes and high cholesterol who presents for a follow-up on weight loss and medication management.  She has been using Zepbound  for weight loss and has successfully lost twenty pounds. She experiences 'food noise', which sometimes leads to snacking, such as eating chicken and chips. She ensures adequate protein intake by carrying tuna and protein shakes, aiming for at least thirty grams of protein per meal. She uses [employer] protein shakes. She is trying to engage in regular exercise, including weightlifting, to maintain muscle mass.  She has a history of pre-diabetes and high cholesterol. She has not started Crestor  for cholesterol management. She is managing a vitamin D  deficiency and requires a refill for her vitamin D  prescription, having run out last week. She takes over-the-counter iron supplements for iron deficiency?? but is interested in prescription iron pills. No history noted on chart for iron deficiency, we will check this today.   She has not had her mammogram this year, which was due in February, and has not undergone a colonoscopy, expressing reluctance towards the procedure. She is considering the shingles vaccine but prefers to wait until she has lost more weight. She notes changes in clothing fit due to weight loss, particularly in shirts and pajamas, and mentions that her rings fit  differently.       Wt Readings from Last 3 Encounters:  01/08/24 195 lb 3.2 oz (88.5 kg)  11/05/23 206 lb 3.2 oz (93.5 kg)  09/29/23 216 lb 12.8 oz (98.3 kg)     The following portions of the patient's history were reviewed and updated as appropriate: past medical history, past surgical history, family history, social history, allergies, medications, and problem list.   Patient Active Problem List   Diagnosis Date Noted   Prediabetes 10/01/2023   Anemia due to vitamin B12 deficiency 09/29/2023   Vitamin D  deficiency 09/29/2023   Hyperlipidemia 09/29/2023   Obesity (BMI 30-39.9) 09/29/2023   Past Medical History:  Diagnosis Date   Anemia    Dislocation of tarsometatarsal joint of right foot 11/02/2020   History of blood transfusion 09/08/2010   [place]   Lisfranc dislocation, right, initial encounter 11/13/2020   Past Surgical History:  Procedure Laterality Date   abdominal      ABDOMINOPLASTY  2006   BILATERAL SALPINGECTOMY  09/27/2012   Procedure: BILATERAL SALPINGECTOMY;  Surgeon: Percy Bracken, MD;  Location: [facility];  Service: Gynecology;  Laterality: Bilateral;   GASTRIC BYPASS  2004   OPEN REDUCTION INTERNAL FIXATION (ORIF) FOOT LISFRANC FRACTURE Right 11/13/2020   Procedure: OPEN REDUCTION INTERNAL FIXATION (ORIF) FOOT LISFRANC FRACTURE;  Surgeon: Amada Backer, MD;  Location: [facility];  Service: Orthopedics;  Laterality: Right;    ROBOTIC ASSISTED TOTAL HYSTERECTOMY  09/27/2012   Procedure: ROBOTIC ASSISTED TOTAL HYSTERECTOMY;  Surgeon: Marie-Lyne Lavoie, MD;  Location: [facility];  Service: Gynecology;  Laterality: N/A;   History reviewed. No pertinent family history.  Current  Outpatient Medications:    tirzepatide  7.5 MG/0.5ML injection vial, Inject 7.5 mg into the skin once a week., Disp: 2 mL, Rfl: 2   rosuvastatin  (CRESTOR ) 20 MG tablet, Take 1 tablet (20 mg total) by mouth daily. (Patient not taking: No sig reported), Disp: 90 tablet, Rfl: 1   Vitamin D ,  Ergocalciferol , (DRISDOL ) 1.25 MG (50000 UNIT) CAPS capsule, Take 1 capsule (50,000 Units total) by mouth every 7 (seven) days., Disp: 12 capsule, Rfl: 1 No Known Allergies   ROS: A complete ROS was performed with pertinent positives/negatives noted in the HPI. The remainder of the ROS are negative.    Objective:   Today's Vitals   01/08/24 1000  BP: 116/70  Pulse: 67  Temp: 97.8 F (36.6 C)  TempSrc: Temporal  SpO2: 97%  Weight: 195 lb 3.2 oz (88.5 kg)  Height: 5\' 5"  (1.651 m)    GENERAL: Well-appearing, in NAD. Well nourished.  SKIN: Pink, warm and dry. No rash, lesion, ulceration, or ecchymoses.  NECK: Trachea midline. Full ROM w/o pain or tenderness. No lymphadenopathy.  RESPIRATORY: Chest wall symmetrical. Respirations even and non-labored. Breath sounds clear to auscultation bilaterally.  CARDIAC: S1, S2 present, regular rate and rhythm. Peripheral pulses 2+ bilaterally.  MSK: Muscle tone and strength appropriate for age.  EXTREMITIES: Without clubbing, cyanosis. Nonpitting edema BLE NEUROLOGIC: No motor or sensory deficits. Steady, even gait.  PSYCH/MENTAL STATUS: Alert, oriented x 3. Cooperative, appropriate mood and affect.   Health Maintenance Due  Topic Date Due   MAMMOGRAM  10/24/2021    No results found for any visits on 01/08/24.  The 10-year ASCVD risk score (Arnett DK, et al., 2019) is: 5.9%     Assessment & Plan:  Assessment and Plan    PreDiabetes  - Check A1c level.  Hyperlipidemia Previously advised Crestor , not initiated. Weight loss may improve cholesterol. - Check cholesterol levels.  B12 Deficiency Anemia, possible iron deficiency  Managed with regular B12 injections.  - Check B12 level today before B12 injection.  - Administer B12 injection today. - Check iron levels. - If low, prescribe ferrous sulfate tablets.  Vitamin D  Deficiency Previously managed with supplementation. - Prescribe 90-day supply of vitamin D      Obesity Managed with Zepbound , transitioning from 5 mg to 7.5 mg. No significant side effects reported. Higher doses may enhance weight loss and cause nausea, though not experienced. - Start 7.5 mg Zepbound  injection. -  Emphasized healthy diet and exercise, including weightlifting. - Discuss healthy diet and exercise, emphasize 30 grams of protein per meal and 30-60 minutes of exercise daily.   General Health Maintenance Discussed routine maintenance including mammogram and colonoscopy. Mammogram not completed,  she states she will call to schedule her mammogram. Declined colonoscopy. Declined cologuard.  - Advise to schedule and complete mammogram.  Follow-up Monitor health conditions and treatment efficacy. - Schedule follow-up appointment in 3 months.       Orders Placed This Encounter  Procedures   Lipid panel   Hemoglobin A1C   Comp Met (CMET)   VITAMIN D  25 Hydroxy (Vit-D Deficiency, Fractures)   CBC with Differential/Platelet   IBC + Ferritin   Vitamin B12   No images are attached to the encounter or orders placed in the encounter. Meds ordered this encounter  Medications   cyanocobalamin  (VITAMIN B12) injection 1,000 mcg   Vitamin D , Ergocalciferol , (DRISDOL ) 1.25 MG (50000 UNIT) CAPS capsule    Sig: Take 1 capsule (50,000 Units total) by mouth every 7 (seven) days.  Dispense:  12 capsule    Refill:  1    Supervising Provider:   Catheryn Cluck [[id number]]    Return in about 3 months (around 04/09/2024) for Chronic Condition follow up.   Gavin Kast, FNP

## 2024-01-08 ENCOUNTER — Ambulatory Visit (INDEPENDENT_AMBULATORY_CARE_PROVIDER_SITE_OTHER): Payer: No Typology Code available for payment source | Admitting: Internal Medicine

## 2024-01-08 ENCOUNTER — Encounter: Payer: Self-pay | Admitting: Internal Medicine

## 2024-01-08 VITALS — BP 116/70 | HR 67 | Temp 97.8°F | Ht 65.0 in | Wt 195.2 lb

## 2024-01-08 DIAGNOSIS — E669 Obesity, unspecified: Secondary | ICD-10-CM

## 2024-01-08 DIAGNOSIS — E785 Hyperlipidemia, unspecified: Secondary | ICD-10-CM | POA: Diagnosis not present

## 2024-01-08 DIAGNOSIS — D519 Vitamin B12 deficiency anemia, unspecified: Secondary | ICD-10-CM

## 2024-01-08 DIAGNOSIS — E559 Vitamin D deficiency, unspecified: Secondary | ICD-10-CM | POA: Diagnosis not present

## 2024-01-08 DIAGNOSIS — R7303 Prediabetes: Secondary | ICD-10-CM | POA: Diagnosis not present

## 2024-01-08 LAB — CBC WITH DIFFERENTIAL/PLATELET
Basophils Absolute: 0 10*3/uL (ref 0.0–0.1)
Basophils Relative: 0.7 % (ref 0.0–3.0)
Eosinophils Absolute: 0 10*3/uL (ref 0.0–0.7)
Eosinophils Relative: 1.2 % (ref 0.0–5.0)
HCT: 37.9 % (ref 36.0–46.0)
Hemoglobin: 12.5 g/dL (ref 12.0–15.0)
Lymphocytes Relative: 37.5 % (ref 12.0–46.0)
Lymphs Abs: 1.1 10*3/uL (ref 0.7–4.0)
MCHC: 32.9 g/dL (ref 30.0–36.0)
MCV: 91.6 fl (ref 78.0–100.0)
Monocytes Absolute: 0.4 10*3/uL (ref 0.1–1.0)
Monocytes Relative: 12.1 % — ABNORMAL HIGH (ref 3.0–12.0)
Neutro Abs: 1.4 10*3/uL (ref 1.4–7.7)
Neutrophils Relative %: 48.5 % (ref 43.0–77.0)
Platelets: 225 10*3/uL (ref 150.0–400.0)
RBC: 4.14 Mil/uL (ref 3.87–5.11)
RDW: 14.6 % (ref 11.5–15.5)
WBC: 3 10*3/uL — ABNORMAL LOW (ref 4.0–10.5)

## 2024-01-08 LAB — COMPREHENSIVE METABOLIC PANEL WITH GFR
ALT: 15 U/L (ref 0–35)
AST: 20 U/L (ref 0–37)
Albumin: 3.9 g/dL (ref 3.5–5.2)
Alkaline Phosphatase: 49 U/L (ref 39–117)
BUN: 17 mg/dL (ref 6–23)
CO2: 31 meq/L (ref 19–32)
Calcium: 9 mg/dL (ref 8.4–10.5)
Chloride: 104 meq/L (ref 96–112)
Creatinine, Ser: 0.59 mg/dL (ref 0.40–1.20)
GFR: 96.72 mL/min (ref >60.00)
Glucose, Bld: 82 mg/dL (ref 70–99)
Potassium: 4.7 meq/L (ref 3.5–5.1)
Sodium: 141 meq/L (ref 135–145)
Total Bilirubin: 0.6 mg/dL (ref 0.2–1.2)
Total Protein: 6.4 g/dL (ref 6.0–8.3)

## 2024-01-08 LAB — IBC + FERRITIN
Ferritin: 152.4 ng/mL (ref 10.0–291.0)
Iron: 95 ug/dL (ref 42–145)
Saturation Ratios: 33.3 % (ref 20.0–50.0)
TIBC: 285.6 ug/dL (ref 250.0–450.0)
Transferrin: 204 mg/dL — ABNORMAL LOW (ref 212.0–360.0)

## 2024-01-08 LAB — LIPID PANEL
Cholesterol: 250 mg/dL — ABNORMAL HIGH (ref 0–200)
HDL: 57.3 mg/dL (ref >39.00)
LDL Cholesterol: 183 mg/dL — ABNORMAL HIGH (ref 0–99)
NonHDL: 193.12
Total CHOL/HDL Ratio: 4
Triglycerides: 49 mg/dL (ref 0.0–149.0)
VLDL: 9.8 mg/dL (ref 0.0–40.0)

## 2024-01-08 LAB — VITAMIN B12: Vitamin B-12: 493 pg/mL (ref 211–911)

## 2024-01-08 LAB — HEMOGLOBIN A1C: Hgb A1c MFr Bld: 5.7 % (ref 4.6–6.5)

## 2024-01-08 LAB — VITAMIN D 25 HYDROXY (VIT D DEFICIENCY, FRACTURES): VITD: 24.78 ng/mL — ABNORMAL LOW (ref 30.00–100.00)

## 2024-01-08 MED ORDER — CYANOCOBALAMIN 1000 MCG/ML IJ SOLN
1000.0000 ug | Freq: Once | INTRAMUSCULAR | Status: AC
Start: 2024-01-08 — End: 2024-01-08
  Administered 2024-01-08: 1000 ug via INTRAMUSCULAR

## 2024-01-08 MED ORDER — VITAMIN D (ERGOCALCIFEROL) 1.25 MG (50000 UNIT) PO CAPS: 50000.0000 [IU] | ORAL_CAPSULE | ORAL | 1 refills | Status: AC

## 2024-01-08 NOTE — Patient Instructions (Signed)
Call to schedule mammogram.

## 2024-01-12 ENCOUNTER — Encounter: Payer: Self-pay | Admitting: Internal Medicine

## 2024-01-15 ENCOUNTER — Telehealth: Payer: Self-pay

## 2024-01-15 NOTE — Telephone Encounter (Signed)
 Last B-12 injection given 01/08/24  Copied from CRM #[id number]. Topic: Clinical - Lab/Test Results >> Jan 15, 2024  9:58 AM Allyne Areola wrote: Reason for CRM: Patient was calling to follow up on lab results she had done on 01/08/2024. I advised of the MyChart message FNP Gavin Kast sent, read the message. Patient understood and had no follow up questions. She would like to know if She would be able to schedule a nurse visit for May 6th for a B12 injection at either 2,2:30 pm.

## 2024-01-18 ENCOUNTER — Telehealth: Payer: Self-pay

## 2024-01-18 NOTE — Progress Notes (Signed)
Results have been relayed to the patient. The patient verbalized understanding. No questions at this time.   

## 2024-01-18 NOTE — Telephone Encounter (Signed)
 Can patient be placed on the nurse schedule for B-12 injection 02/11/25 @ 1p

## 2024-02-12 ENCOUNTER — Ambulatory Visit

## 2024-02-18 ENCOUNTER — Telehealth: Payer: Self-pay

## 2024-02-18 NOTE — Telephone Encounter (Signed)
 Please advise Copied from CRM [id number]. Topic: Clinical - Medication Question >> Feb 18, 2024 10:53 AM Bambi Bonine D wrote: Reason for CRM: Pt wants to inform Dr.Myers that she is ready for the Zepbound  10MG  injection and would like to have it sent to [facility].

## 2024-02-19 ENCOUNTER — Other Ambulatory Visit: Payer: Self-pay | Admitting: Internal Medicine

## 2024-02-19 DIAGNOSIS — E669 Obesity, unspecified: Secondary | ICD-10-CM

## 2024-02-19 MED ORDER — TIRZEPATIDE-WEIGHT MANAGEMENT 10 MG/0.5ML ~~LOC~~ SOLN
10.0000 mg | SUBCUTANEOUS | 2 refills | Status: AC
Start: 2024-02-19 — End: ?

## 2024-04-08 ENCOUNTER — Ambulatory Visit: Admitting: Internal Medicine

## 2024-04-11 ENCOUNTER — Encounter: Payer: Self-pay | Admitting: Internal Medicine

## 2024-04-22 ENCOUNTER — Ambulatory Visit: Admitting: Internal Medicine

## 2024-04-26 ENCOUNTER — Telehealth: Payer: Self-pay | Admitting: Internal Medicine

## 2024-04-26 NOTE — Telephone Encounter (Signed)
 04/08/2024 same day cancel/car issues 04/22/2024 same day cancel/no reason listed by [id number]  Final warning sent via mail and mychart.

## 2024-04-26 NOTE — Telephone Encounter (Signed)
 Provider aware.
